# Patient Record
Sex: Female | Born: 2000 | Race: Black or African American | Hispanic: No | Marital: Single | Smoking: Never smoker
Health system: Southern US, Community
[De-identification: ages and names within clinical notes are randomized; demographics above are authoritative.]

## PROBLEM LIST (undated history)

## (undated) HISTORY — PX: NO PAST SURGERIES: SHX2092

---
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
## 2019-01-18 ENCOUNTER — Telehealth: Payer: Self-pay | Admitting: Family Medicine

## 2019-01-18 NOTE — Telephone Encounter (Signed)
Okay to make an appointment.  We can either do it virtually or in the office.  And rather wait and do the blood work afterwards in case I want to check some additional things besides iron.

## 2019-01-18 NOTE — Telephone Encounter (Signed)
Patient states that she has been feeling really cold all the time, really tired not wanting to do anything. States she has been feeling like this for a month now. Wanted to know if she can get her iron checked and come in to see Dr.Metheney for this. Please advise.

## 2019-01-18 NOTE — Telephone Encounter (Signed)
Patient wanted to come in the office. Appointment has been made. No further questions at this time.

## 2019-01-21 ENCOUNTER — Encounter: Payer: Self-pay | Admitting: Family Medicine

## 2019-01-21 ENCOUNTER — Other Ambulatory Visit: Payer: Self-pay

## 2019-01-21 ENCOUNTER — Ambulatory Visit (INDEPENDENT_AMBULATORY_CARE_PROVIDER_SITE_OTHER): Payer: Self-pay | Admitting: Family Medicine

## 2019-01-21 VITALS — BP 103/62 | HR 71 | Temp 98.3°F | Ht 62.0 in | Wt 124.0 lb

## 2019-01-21 DIAGNOSIS — Z113 Encounter for screening for infections with a predominantly sexual mode of transmission: Secondary | ICD-10-CM

## 2019-01-21 DIAGNOSIS — R5383 Other fatigue: Secondary | ICD-10-CM

## 2019-01-21 DIAGNOSIS — R7989 Other specified abnormal findings of blood chemistry: Secondary | ICD-10-CM | POA: Diagnosis not present

## 2019-01-21 DIAGNOSIS — R6889 Other general symptoms and signs: Secondary | ICD-10-CM

## 2019-01-21 DIAGNOSIS — R6883 Chills (without fever): Secondary | ICD-10-CM | POA: Diagnosis not present

## 2019-01-21 LAB — POCT RAPID STREP A (OFFICE): Rapid Strep A Screen: NEGATIVE

## 2019-01-21 NOTE — Progress Notes (Signed)
Acute Office Visit  Subjective:    Patient ID: [employer], female    DOB: 2000/11/11, 18 y.o.   MRN: [id number]  Chief Complaint  Patient presents with  . Chills  . Fatigue    HPI Patient is in today for chills and fatigue.  She I concerned about being iron deficient.  She says it really started as soon as she came off of antibiotics for strep throat back at the end of April.  She had had 2 episodes of strep throat back to back.  She is also just had some cold intolerance since then and just feels extremely tired to the point that she has not been working out and exercising.  She has been purposely try to get a good 8 hours of sleep and says it does not really help she still just wakes up exhausted.  She denies any fevers or sweats.  She denies any urinary symptoms.  No nausea vomiting or diarrhea.  She denies any ice cravings or any unusual food cravings..  She denies any urinary symptoms.  He denies any abnormal lymph nodes or swollen areas on the body.  No sore throat no shortness of breath or cough.  No past medical history on file.  Past Surgical History:  Procedure Laterality Date  . NO PAST SURGERIES    . ROOT CANAL  10/2015    Family History  Problem Relation Age of Onset  . Hypertension Father   . Food Allergy Brother        peanut, shellfish, fish  . Allergic rhinitis Brother   . Cancer Maternal Grandmother        lung  . Hypertension Maternal Grandmother   . Asthma Cousin   . Eczema Cousin   . Angioedema Neg Hx   . Atopy Neg Hx   . Immunodeficiency Neg Hx   . Urticaria Neg Hx     Social History   Socioeconomic History  . Marital status: Single    Spouse name: Not on file  . Number of children: 0  . Years of education: Not on file  . Highest education level: Not on file  Occupational History  . Occupation: Ship broker  Social Needs  . Financial resource strain: Not on file  . Food insecurity    Worry: Not on file    Inability: Not on file  .  Transportation needs    Medical: Not on file    Non-medical: Not on file  Tobacco Use  . Smoking status: Never Smoker  . Smokeless tobacco: Never Used  Substance and Sexual Activity  . Alcohol use: No    Alcohol/week: 0.0 standard drinks  . Drug use: Yes  . Sexual activity: Yes    Birth control/protection: Condom  Lifestyle  . Physical activity    Days per week: Not on file    Minutes per session: Not on file  . Stress: Not on file  Relationships  . Social Herbalist on phone: Not on file    Gets together: Not on file    Attends religious service: Not on file    Active member of club or organization: Not on file    Attends meetings of clubs or organizations: Not on file    Relationship status: Not on file  . Intimate partner violence    Fear of current or ex partner: Not on file    Emotionally abused: Not on file    Physically abused: Not on file  Forced sexual activity: Not on file  Other Topics Concern  . Not on file  Social History Narrative   She currently lives with her mother starlet and her brother Delos Haring. Her parents are separated. Her mother is a Diplomatic Services operational officer and her father is a Insurance claims handler. She currently attends [employer] and is in the 10th grade. She runs track in the fall in the spring and plans on being a fashion designer    Outpatient Medications Prior to Visit  Medication Sig Dispense Refill  . Norgestimate-Ethinyl Estradiol Triphasic 0.18/0.215/0.25 MG-25 MCG tab Take 1 tablet by mouth daily. 3 Package 4   No facility-administered medications prior to visit.     No Known Allergies  ROS     Objective:    Physical Exam  Constitutional: She is oriented to person, place, and time. She appears well-developed and well-nourished.  HENT:  Head: Normocephalic and atraumatic.  Right Ear: External ear normal.  Left Ear: External ear normal.  Nose: Nose normal.  Mouth/Throat: Oropharynx is clear and moist.  TMs and canals are clear.    Eyes: Pupils are equal, round, and reactive to light. Conjunctivae and EOM are normal.  Neck: Neck supple. No thyromegaly present.  Cardiovascular: Normal rate, regular rhythm and normal heart sounds.  Pulmonary/Chest: Effort normal and breath sounds normal. She has no wheezes.  Abdominal: Soft. Bowel sounds are normal. She exhibits no distension and no mass. There is no abdominal tenderness. There is no rebound and no guarding.  Lymphadenopathy:    She has no cervical adenopathy.  Neurological: She is alert and oriented to person, place, and time.  Skin: Skin is warm and dry.  Psychiatric: She has a normal mood and affect. Her behavior is normal.    BP 103/62   Pulse 71   Temp 98.3 F (36.8 C)   Ht _0  (1.575 m)   Wt 124 lb (56.2 kg)   LMP 01/04/2019 (Exact Date)   SpO2 100%   BMI 22.68 kg/m  Wt Readings from Last 3 Encounters:  01/21/19 124 lb (56.2 kg) (44 %, Z= -0.14)*  12/22/18 125 lb (56.7 kg) (47 %, Z= -0.08)*  10/26/18 126 lb (57.2 kg) (49 %, Z= -0.01)*   * Growth percentiles are based on CDC (Girls, 2-20 Years) data.    Health Maintenance Due  Topic Date Due  . HIV Screening  10/31/2014  . CHLAMYDIA SCREENING  06/17/2018    There are no preventive care reminders to display for this patient.   No results found for: TSH No results found for: WBC, HGB, HCT, MCV, PLT No results found for: NA, K, CHLORIDE, CO2, GLUCOSE, BUN, CREATININE, BILITOT, ALKPHOS, AST, ALT, PROT, ALBUMIN, CALCIUM, ANIONGAP, EGFR, GFR No results found for: CHOL No results found for: HDL No results found for: LDLCALC No results found for: TRIG No results found for: CHOLHDL No results found for: HGBA1C     Assessment & Plan:   Problem List Items Addressed This Visit    None    Visit Diagnoses    Fatigue, unspecified type    -  Primary   Relevant Orders   COMPLETE METABOLIC PANEL WITH GFR   CBC   Fe+TIBC+Fer   B12 and Folate Panel   TSH   POCT rapid strep A (Completed)    Chills       Relevant Orders   COMPLETE METABOLIC PANEL WITH GFR   CBC   Fe+TIBC+Fer   B12 and Folate Panel  TSH   Cold intolerance       Screening examination for STD (sexually transmitted disease)       Relevant Orders   C. trachomatis/N. gonorrhoeae RNA     Fatigue and chills-overall doing well with no other specific symptoms but will check for iron deficiency, anemia, thyroid disorder etc.  It is interesting that she started feeling poorly as soon as she finished her antibiotics for strep throat so I do think it is worth may be starting her throat again even though she does not have an active sore throat.  Cold intolerance-check thyroid level as well as check for anemia.  Since she is on birth control recommend yearly STD screening for gonorrhea and chlamydia that was collected today.    No orders of the defined types were placed in this encounter.    Beatrice Lecher, MD

## 2019-01-22 LAB — CBC
HCT: 43.8 % (ref 35.0–45.0)
Hemoglobin: 14.4 g/dL (ref 11.7–15.5)
MCH: 27.3 pg (ref 27.0–33.0)
MCHC: 32.9 g/dL (ref 32.0–36.0)
MCV: 83.1 fL (ref 80.0–100.0)
MPV: 9.7 fL (ref 7.5–12.5)
Platelets: 281 10*3/uL (ref 140–400)
RBC: 5.27 10*6/uL — ABNORMAL HIGH (ref 3.80–5.10)
RDW: 13.2 % (ref 11.0–15.0)
WBC: 4 10*3/uL (ref 3.8–10.8)

## 2019-01-22 LAB — COMPLETE METABOLIC PANEL WITH GFR
AG Ratio: 1.3 (calc) (ref 1.0–2.5)
ALT: 10 U/L (ref 5–32)
AST: 15 U/L (ref 12–32)
Albumin: 4.3 g/dL (ref 3.6–5.1)
Alkaline phosphatase (APISO): 43 U/L (ref 36–128)
BUN: 10 mg/dL (ref 7–20)
CO2: 24 mmol/L (ref 20–32)
Calcium: 9.6 mg/dL (ref 8.9–10.4)
Chloride: 104 mmol/L (ref 98–110)
Creat: 0.79 mg/dL (ref 0.50–1.00)
GFR, Est African American: 126 mL/min/{1.73_m2} (ref >=60)
GFR, Est Non African American: 109 mL/min/{1.73_m2} (ref >=60)
Globulin: 3.4 g/dL (calc) (ref 2.0–3.8)
Glucose, Bld: 81 mg/dL (ref 65–99)
Potassium: 4.4 mmol/L (ref 3.8–5.1)
Sodium: 136 mmol/L (ref 135–146)
Total Bilirubin: 0.4 mg/dL (ref 0.2–1.1)
Total Protein: 7.7 g/dL (ref 6.3–8.2)

## 2019-01-22 LAB — TSH: TSH: 2.39 mIU/L

## 2019-01-22 LAB — C. TRACHOMATIS/N. GONORRHOEAE RNA
C. trachomatis RNA, TMA: NOT DETECTED
N. gonorrhoeae RNA, TMA: NOT DETECTED

## 2019-01-22 LAB — IRON,TIBC AND FERRITIN PANEL
%SAT: 25 % (calc) (ref 15–45)
Ferritin: 7 ng/mL — ABNORMAL LOW (ref 16–154)
Iron: 113 ug/dL (ref 27–164)
TIBC: 453 mcg/dL (calc) — ABNORMAL HIGH (ref 271–448)

## 2019-01-22 LAB — B12 AND FOLATE PANEL
Folate: 17 ng/mL
Vitamin B-12: 644 pg/mL (ref 200–1100)

## 2019-04-20 DIAGNOSIS — L700 Acne vulgaris: Secondary | ICD-10-CM | POA: Diagnosis not present

## 2019-06-21 ENCOUNTER — Ambulatory Visit (INDEPENDENT_AMBULATORY_CARE_PROVIDER_SITE_OTHER): Payer: BC Managed Care – PPO | Admitting: Family Medicine

## 2019-06-21 ENCOUNTER — Other Ambulatory Visit: Payer: Self-pay

## 2019-06-21 ENCOUNTER — Encounter: Payer: Self-pay | Admitting: Family Medicine

## 2019-06-21 VITALS — BP 131/71 | HR 69 | Ht 62.0 in | Wt 126.1 lb

## 2019-06-21 DIAGNOSIS — L309 Dermatitis, unspecified: Secondary | ICD-10-CM | POA: Insufficient documentation

## 2019-06-21 NOTE — Progress Notes (Signed)
Acute Office Visit  Subjective:    Patient ID: Jasmine Wyatt, female    DOB: June 14, 2001, 18 y.o.   MRN: [id number]  Chief Complaint  Patient presents with  . Rash    Started in axillary region and has spread to her lower back area, itching, raised, "looks like goosebumps", denies changes in soap/body wash/detergent/lotions/perfumes/deoderant    HPI Patient is in today for Started in axillary region and has spread to her lower back area, itching, raised, "looks like goosebumps", denies changes in soap/body wash/detergent/lotions/perfumes/deoderant.  She has been mostly using an over-the-counter topical steroid cream and that does seem to help.  She is been putting a moisturizer on the morning but typically takes her shower in the evenings.  She says it is itchy at times but mostly when fabric or things touch her skin.  No fevers chills or sweats.  She does have a cousin and brother with a history of eczema.  History reviewed. No pertinent past medical history.  Past Surgical History:  Procedure Laterality Date  . NO PAST SURGERIES    . ROOT CANAL  10/2015    Family History  Problem Relation Age of Onset  . Hypertension Father   . Food Allergy Brother        peanut, shellfish, fish  . Allergic rhinitis Brother   . Cancer Maternal Grandmother        lung  . Hypertension Maternal Grandmother   . Asthma Cousin   . Eczema Cousin   . Angioedema Neg Hx   . Atopy Neg Hx   . Immunodeficiency Neg Hx   . Urticaria Neg Hx     Social History   Socioeconomic History  . Marital status: Single    Spouse name: Not on file  . Number of children: 0  . Years of education: Not on file  . Highest education level: Not on file  Occupational History  . Occupation: Ship broker  Tobacco Use  . Smoking status: Never Smoker  . Smokeless tobacco: Never Used  Substance and Sexual Activity  . Alcohol use: No    Alcohol/week: 0.0 standard drinks  . Drug use: Yes  . Sexual activity: Yes     Birth control/protection: Condom  Other Topics Concern  . Not on file  Social History Narrative   She currently lives with her mother starlet and her brother Delos Haring. Her parents are separated. Her mother is a Diplomatic Services operational officer and her father is a Insurance claims handler. She currently attends [employer] and is in the 10th grade. She runs track in the fall in the spring and plans on being a fashion designer   Social Determinants of Health   Financial Resource Strain:   . Difficulty of Paying Living Expenses: Not on file  Food Insecurity:   . Worried About Charity fundraiser in the Last Year: Not on file  . Ran Out of Food in the Last Year: Not on file  Transportation Needs:   . Lack of Transportation (Medical): Not on file  . Lack of Transportation (Non-Medical): Not on file  Physical Activity:   . Days of Exercise per Week: Not on file  . Minutes of Exercise per Session: Not on file  Stress:   . Feeling of Stress : Not on file  Social Connections:   . Frequency of Communication with Friends and Family: Not on file  . Frequency of Social Gatherings with Friends and Family: Not on file  . Attends Religious Services: Not  on file  . Active Member of Clubs or Organizations: Not on file  . Attends BankerClub or Organization Meetings: Not on file  . Marital Status: Not on file  Intimate Partner Violence:   . Fear of Current or Ex-Partner: Not on file  . Emotionally Abused: Not on file  . Physically Abused: Not on file  . Sexually Abused: Not on file    Outpatient Medications Prior to Visit  Medication Sig Dispense Refill  . Norgestimate-Ethinyl Estradiol Triphasic 0.18/0.215/0.25 MG-25 MCG tab Take 1 tablet by mouth daily. 3 Package 4   No facility-administered medications prior to visit.    No Known Allergies  Review of Systems     Objective:    Physical Exam Vitals reviewed.  Constitutional:      Appearance: She is well-developed.  HENT:     Head: Normocephalic and atraumatic.   Eyes:     Conjunctiva/sclera: Conjunctivae normal.  Cardiovascular:     Rate and Rhythm: Normal rate.  Pulmonary:     Effort: Pulmonary effort is normal.  Skin:    General: Skin is dry.     Coloration: Skin is not pale.     Comments: Skin looks a little dry on the back of her low back and upper back and under around the axilla.  There is a few papular lesions.  No distinct erythema or thick scale.  Neurological:     Mental Status: She is alert and oriented to person, place, and time.  Psychiatric:        Behavior: Behavior normal.     BP 131/71 (BP Location: Right Arm, Patient Position: Sitting, Cuff Size: Normal)   Pulse 69   Ht 5\' 2"  (1.575 m)   Wt 126 lb 1.3 oz (57.2 kg)   LMP 06/15/2019   SpO2 100%   BMI 23.06 kg/m  Wt Readings from Last 3 Encounters:  06/21/19 126 lb 1.3 oz (57.2 kg) (47 %, Z= -0.08)*  01/21/19 124 lb (56.2 kg) (44 %, Z= -0.14)*  12/22/18 125 lb (56.7 kg) (47 %, Z= -0.08)*   * Growth percentiles are based on CDC (Girls, 2-20 Years) data.    Health Maintenance Due  Topic Date Due  . HIV Screening  10/31/2014    There are no preventive care reminders to display for this patient.   Lab Results  Component Value Date   TSH 2.39 01/21/2019   Lab Results  Component Value Date   WBC 4.0 01/21/2019   HGB 14.4 01/21/2019   HCT 43.8 01/21/2019   MCV 83.1 01/21/2019   PLT 281 01/21/2019   Lab Results  Component Value Date   NA 136 01/21/2019   K 4.4 01/21/2019   CO2 24 01/21/2019   GLUCOSE 81 01/21/2019   BUN 10 01/21/2019   CREATININE 0.79 01/21/2019   BILITOT 0.4 01/21/2019   AST 15 01/21/2019   ALT 10 01/21/2019   PROT 7.7 01/21/2019   CALCIUM 9.6 01/21/2019   No results found for: CHOL No results found for: HDL No results found for: LDLCALC No results found for: TRIG No results found for: CHOLHDL No results found for: [id number]     Assessment & Plan:   Problem List Items Addressed This Visit      Musculoskeletal and  Integument   Eczema - Primary    Based on exam most consistent with eczema.  Seems to be responding well to topical corticosteroid.  Avoid taking hot showers and recommend applying a good moisturizer such as  Cetaphil to the affected areas immediately after showering.  Okay to continue applying the over-the-counter topical steroid.  If you need something stronger than let me know and we will can send over some triamcinolone ointment.  We can also consider checking her thyroid if she is never really had issues before.          No orders of the defined types were placed in this encounter.    Nani Gasser, MD

## 2019-06-21 NOTE — Patient Instructions (Signed)

## 2019-06-21 NOTE — Assessment & Plan Note (Signed)
Based on exam most consistent with eczema.  Seems to be responding well to topical corticosteroid.  Avoid taking hot showers and recommend applying a good moisturizer such as Cetaphil to the affected areas immediately after showering.  Okay to continue applying the over-the-counter topical steroid.  If you need something stronger than let me know and we will can send over some triamcinolone ointment.  We can also consider checking her thyroid if she is never really had issues before.

## 2019-07-29 ENCOUNTER — Telehealth: Payer: BC Managed Care – PPO | Admitting: Family Medicine

## 2019-09-16 ENCOUNTER — Telehealth (INDEPENDENT_AMBULATORY_CARE_PROVIDER_SITE_OTHER): Payer: BC Managed Care – PPO | Admitting: Family Medicine

## 2019-09-16 ENCOUNTER — Encounter: Payer: Self-pay | Admitting: Family Medicine

## 2019-09-16 VITALS — Ht 62.0 in | Wt 115.0 lb

## 2019-09-16 DIAGNOSIS — L709 Acne, unspecified: Secondary | ICD-10-CM | POA: Insufficient documentation

## 2019-09-16 DIAGNOSIS — Z30011 Encounter for initial prescription of contraceptive pills: Secondary | ICD-10-CM | POA: Diagnosis not present

## 2019-09-16 DIAGNOSIS — L708 Other acne: Secondary | ICD-10-CM

## 2019-09-16 NOTE — Assessment & Plan Note (Signed)
Okay to stop her birth control pills.  She is not currently sexually active but did encourage her to call us back if any point she plans on becoming sexually active or if her periods become more heavy or painful with cramps.  We can certainly try a different pill she does not have to continue the one that we started back in the summer.

## 2019-09-16 NOTE — Assessment & Plan Note (Signed)
Currently using urology program.  Not sure if she is actually using a retinoid or adapalene but did suggest maybe a trial of over-the-counter Differin if she is not getting good results with her current program.  Be happy to discuss other prescription options and if in any point she would feel that would be helpful.

## 2019-09-16 NOTE — Progress Notes (Signed)
Virtual Visit via Video Note  I connected with Jasmine Wyatt on 09/16/19 at  2:20 PM EST by a video enabled telemedicine application and verified that I am speaking with the correct person using two identifiers.   I discussed the limitations of evaluation and management by telemedicine and the availability of in person appointments. The patient expressed understanding and agreed to proceed.  Subjective:    CC:   HPI: Pt would like to stop birth control. She stated that she has had a period for 2 weeks. She has been a little stressed and would also like to see if th acne clears up if she no longer is on it.  She says sometimes she feels like the birth control is helping her acne and then other times she feels like it is actually making it worse.  She has been using [facility] program for her acne and just had a chemical peel about 2 weeks ago.  She also asked about a bill that her father received from [facility] that is not being paid thru her insurance. I advised her to call the billing department and ask if this was due to a coding issue and if so to let us know and we will see what can be done on our end to help correct this. She voiced understanding and agreed.   Past medical history, Surgical history, Family history not pertinant except as noted below, Social history, Allergies, and medications have been entered into the medical record, reviewed, and corrections made.   Review of Systems: No fevers, chills, night sweats, weight loss, chest pain, or shortness of breath.   Objective:    General: Speaking clearly in complete sentences without any shortness of breath.  Alert and oriented x3.  Normal judgment. No apparent acute distress.    Impression and Recommendations:    OCP (oral contraceptive pills) initiation Okay to stop her birth control pills.  She is not currently sexually active but did encourage her to call us back if any point she plans on becoming sexually active or if her  periods become more heavy or painful with cramps.  We can certainly try a different pill she does not have to continue the one that we started back in the summer.  Acne Currently using urology program.  Not sure if she is actually using a retinoid or adapalene but did suggest maybe a trial of over-the-counter Differin if she is not getting good results with her current program.  Be happy to discuss other prescription options and if in any point she would feel that would be helpful.   In regards to her lab bill did encourage her to call and find out what was not covered and then let us know we can try to resubmit codes to see if we can get it covered.  I suspect it may be the B12 and possibly the folate that was not covered.   Time spent in encounter 21 minutes  I discussed the assessment and treatment plan with the patient. The patient was provided an opportunity to ask questions and all were answered. The patient agreed with the plan and demonstrated an understanding of the instructions.   The patient was advised to call back or seek an in-person evaluation if the symptoms worsen or if the condition fails to improve as anticipated.   Nani Gasser, MD

## 2019-09-16 NOTE — Progress Notes (Signed)
Pt would like to stop birth control. She stated that she has had a period for 2 weeks. She has been a little stressed and would also like to see if th acne clears up if she no longer is on it.  She also asked about a bill that her father received from [facility] that is not being paid thru her insurance. I advised her to call the billing department and ask if this was due to a coding issue and if so to let us know and we will see what can be done on our end to help correct this. She voiced understanding and agreed.

## 2019-09-17 ENCOUNTER — Encounter: Payer: Self-pay | Admitting: Family Medicine

## 2019-09-24 NOTE — Telephone Encounter (Signed)
Lets try: R68.89Other general symptoms and signs    Please let patient know once we have resubmitted a new code just so that she can be on the look out with her insurance.

## 2019-10-20 ENCOUNTER — Ambulatory Visit: Payer: BC Managed Care – PPO | Admitting: Family Medicine

## 2019-10-21 DIAGNOSIS — F4323 Adjustment disorder with mixed anxiety and depressed mood: Secondary | ICD-10-CM | POA: Diagnosis not present

## 2019-10-28 DIAGNOSIS — F4323 Adjustment disorder with mixed anxiety and depressed mood: Secondary | ICD-10-CM | POA: Diagnosis not present

## 2019-11-04 DIAGNOSIS — F4323 Adjustment disorder with mixed anxiety and depressed mood: Secondary | ICD-10-CM | POA: Diagnosis not present

## 2019-12-29 ENCOUNTER — Telehealth (INDEPENDENT_AMBULATORY_CARE_PROVIDER_SITE_OTHER): Payer: BC Managed Care – PPO | Admitting: Family Medicine

## 2019-12-29 ENCOUNTER — Telehealth: Payer: BC Managed Care – PPO | Admitting: Family Medicine

## 2019-12-29 ENCOUNTER — Encounter: Payer: Self-pay | Admitting: Family Medicine

## 2019-12-29 VITALS — Temp 97.0°F | Ht 62.0 in | Wt 114.0 lb

## 2019-12-29 DIAGNOSIS — F418 Other specified anxiety disorders: Secondary | ICD-10-CM

## 2019-12-29 DIAGNOSIS — Z3009 Encounter for other general counseling and advice on contraception: Secondary | ICD-10-CM | POA: Diagnosis not present

## 2019-12-29 NOTE — Progress Notes (Signed)
Virtual Visit via Video Note  I connected with Jasmine Wyatt on 12/30/19 at 10:10 AM EDT by a video enabled telemedicine application and verified that I am speaking with the correct person using two identifiers.   I discussed the limitations of evaluation and management by telemedicine and the availability of in person appointments. The patient expressed understanding and agreed to proceed.  Patient location: in her car Provider location: in office  Subjective:    CC: stress  HPI: Patient reports that she has been feeling very stressed and down probably since February.  She had a break-up that hit her pretty hard.  She says her mom was really supportive during that time.  But she said she had such severe symptoms that she actually lost 25 pounds and she would just start crying uncontrollably.  She feels like things have gotten better overall since then she is actually even gained a little bit of the weight back but noticed that she still struggling with her mood some.  She said stopping her birth control definitely made the symptoms worse so she would like to get back on birth control  She had stopped her birth control back in mid March and says that she noticed a significant difference in her mood after coming off of birth control so even though she is not currently sexually active she would like to restart it..   Past medical history, Surgical history, Family history not pertinant except as noted below, Social history, Allergies, and medications have been entered into the medical record, reviewed, and corrections made.   Review of Systems: No fevers, chills, night sweats, weight loss, chest pain, or shortness of breath.   Objective:    General: Speaking clearly in complete sentences without any shortness of breath.  Alert and oriented x3.  Normal judgment. No apparent acute distress.    Impression and Recommendations:    Encounter for counseling regarding contraception Discussed  options she would like to restart birth control she definitely noticed an appreciable difference in her mood being off of birth control.  She was happy with the pill that she was on previously so we will just restart that for now went ahead and sent a year supply  Depression with anxiety Gust options.  I do think she would benefit from therapy/counseling.  In addition we also discussed starting medication to help her transition through this time.  I think this could be really helpful for her.  I think it may help take some of that edge off and tearfulness that she has been experiencing.  We will start with Lexapro.  I will see her back in 3 to 4 weeks to make sure that she is doing well discussed potential side effects.  GAD - 7 score of 13 and PHQ- 9 score of 5.      Time spent in encounter 22 minutes  I discussed the assessment and treatment plan with the patient. The patient was provided an opportunity to ask questions and all were answered. The patient agreed with the plan and demonstrated an understanding of the instructions.   The patient was advised to call back or seek an in-person evaluation if the symptoms worsen or if the condition fails to improve as anticipated.   Nani Gasser, MD

## 2019-12-29 NOTE — Progress Notes (Signed)
Pt reports that she has been feeling like this since February but feels that things has since gotten better.   She stated that she has started working again and has the support of her mother and counseling and this has helped her.   She reports that back in mid [place] she stopped her birth control and would like to restart.

## 2019-12-30 ENCOUNTER — Telehealth: Payer: Self-pay | Admitting: Family Medicine

## 2019-12-30 DIAGNOSIS — F418 Other specified anxiety disorders: Secondary | ICD-10-CM | POA: Insufficient documentation

## 2019-12-30 MED ORDER — ESCITALOPRAM OXALATE 10 MG PO TABS: ORAL_TABLET | ORAL | 1 refills | Status: DC

## 2019-12-30 MED ORDER — NORGESTIM-ETH ESTRAD TRIPHASIC 0.18/0.215/0.25 MG-25 MCG PO TABS: 1.0000 | ORAL_TABLET | Freq: Every day | ORAL | 3 refills | Status: DC

## 2019-12-30 NOTE — Addendum Note (Signed)
Addended by: Nani Gasser D on: 12/30/2019 07:56 AM   Modules accepted: Orders

## 2019-12-30 NOTE — Assessment & Plan Note (Signed)
Gust options.  I do think she would benefit from therapy/counseling.  In addition we also discussed starting medication to help her transition through this time.  I think this could be really helpful for her.  I think it may help take some of that edge off and tearfulness that she has been experiencing.  We will start with Lexapro.  I will see her back in 3 to 4 weeks to make sure that she is doing well discussed potential side effects.

## 2019-12-30 NOTE — Assessment & Plan Note (Signed)
Discussed options she would like to restart birth control she definitely noticed an appreciable difference in her mood being off of birth control.  She was happy with the pill that she was on previously so we will just restart that for now went ahead and sent a year supply

## 2019-12-30 NOTE — Telephone Encounter (Signed)
Left message for a return call

## 2019-12-30 NOTE — Telephone Encounter (Signed)
Please call patient and clarify if she wants me to place a referral for a counselor or therapist or if she already has somebody in mind.  I just could not remember what we had decided after our discussion yesterday.  There is a good therapist in [facility] called Santina [facility]rier if that would be convenient for her.

## 2020-01-03 NOTE — Telephone Encounter (Signed)
Called and spoke to patient. She requested that I send this information to her on MyChart and she will respond there.

## 2020-02-14 ENCOUNTER — Encounter: Payer: Self-pay | Admitting: Family Medicine

## 2020-02-14 ENCOUNTER — Telehealth (INDEPENDENT_AMBULATORY_CARE_PROVIDER_SITE_OTHER): Payer: Self-pay | Admitting: Family Medicine

## 2020-02-14 VITALS — Ht 62.0 in

## 2020-02-14 DIAGNOSIS — Z5329 Procedure and treatment not carried out because of patient's decision for other reasons: Secondary | ICD-10-CM

## 2020-02-14 DIAGNOSIS — F418 Other specified anxiety disorders: Secondary | ICD-10-CM

## 2020-02-14 NOTE — Progress Notes (Signed)
Left 2 voice mails for pt. Advised her on the second voice mail to sign in for her visit so Dr. Linford Arnold can see her today.

## 2020-02-14 NOTE — Progress Notes (Signed)
Virtual Visit via Video Note   NO SHOW.  She was called twice and left messages.  Also sent a text to connect to my chart and patient never connected.

## 2020-03-06 ENCOUNTER — Encounter: Payer: Self-pay | Admitting: Family Medicine

## 2020-03-06 ENCOUNTER — Ambulatory Visit (INDEPENDENT_AMBULATORY_CARE_PROVIDER_SITE_OTHER): Payer: Managed Care, Other (non HMO) | Admitting: Family Medicine

## 2020-03-06 VITALS — BP 127/79 | HR 101 | Ht 62.0 in | Wt 118.0 lb

## 2020-03-06 DIAGNOSIS — N898 Other specified noninflammatory disorders of vagina: Secondary | ICD-10-CM

## 2020-03-06 DIAGNOSIS — J029 Acute pharyngitis, unspecified: Secondary | ICD-10-CM

## 2020-03-06 LAB — WET PREP FOR TRICH, YEAST, CLUE
MICRO NUMBER:: 10888090
Specimen Quality: ADEQUATE

## 2020-03-06 NOTE — Progress Notes (Signed)
Acute Office Visit  Subjective:    Patient ID: Jasmine Wyatt, female    DOB: 2000-11-06, 19 y.o.   MRN: [id number]  Chief Complaint  Patient presents with  . std testing  . Vaginitis    HPI Patient is in today for change in vaginal discharge with some itching mostly in the mornings.  No external lesions.  She did have unprotected sex recently.  She has not tried any over-the-counter medications for relief.  ST for a few days as well.  She says it is mild she has not had any cough or congestion etc. been about the same time that she is had some vaginal discomfort.  She wonders if it could be gonorrhea and is concerned about that.  No past medical history on file.  Past Surgical History:  Procedure Laterality Date  . NO PAST SURGERIES    . ROOT CANAL  10/2015    Family History  Problem Relation Age of Onset  . Hypertension Father   . Food Allergy Brother        peanut, shellfish, fish  . Allergic rhinitis Brother   . Cancer Maternal Grandmother        lung  . Hypertension Maternal Grandmother   . Asthma Cousin   . Eczema Cousin   . Angioedema Neg Hx   . Atopy Neg Hx   . Immunodeficiency Neg Hx   . Urticaria Neg Hx     Social History   Socioeconomic History  . Marital status: Single    Spouse name: Not on file  . Number of children: 0  . Years of education: Not on file  . Highest education level: Not on file  Occupational History  . Occupation: Consulting civil engineer  Tobacco Use  . Smoking status: Never Smoker  . Smokeless tobacco: Never Used  Vaping Use  . Vaping Use: Never used  Substance and Sexual Activity  . Alcohol use: No    Alcohol/week: 0.0 standard drinks  . Drug use: Yes  . Sexual activity: Yes    Birth control/protection: Condom  Other Topics Concern  . Not on file  Social History Narrative   She currently lives with her mother starlet and her brother Forde Radon. Her parents are separated. Her mother is a Social research officer, government and her father is a Actuary. She  currently attends [employer] and is in the 10th grade. She runs track in the fall in the spring and plans on being a fashion designer   Social Determinants of Health   Financial Resource Strain:   . Difficulty of Paying Living Expenses: Not on file  Food Insecurity:   . Worried About Programme researcher, broadcasting/film/video in the Last Year: Not on file  . Ran Out of Food in the Last Year: Not on file  Transportation Needs:   . Lack of Transportation (Medical): Not on file  . Lack of Transportation (Non-Medical): Not on file  Physical Activity:   . Days of Exercise per Week: Not on file  . Minutes of Exercise per Session: Not on file  Stress:   . Feeling of Stress : Not on file  Social Connections:   . Frequency of Communication with Friends and Family: Not on file  . Frequency of Social Gatherings with Friends and Family: Not on file  . Attends Religious Services: Not on file  . Active Member of Clubs or Organizations: Not on file  . Attends Banker Meetings: Not on file  .  Marital Status: Not on file  Intimate Partner Violence:   . Fear of Current or Ex-Partner: Not on file  . Emotionally Abused: Not on file  . Physically Abused: Not on file  . Sexually Abused: Not on file    Outpatient Medications Prior to Visit  Medication Sig Dispense Refill  . escitalopram (LEXAPRO) 10 MG tablet 1/2 tab po QD x 8 days and then increase to 1 tab daily 30 tablet 1  . Norgestimate-Ethinyl Estradiol Triphasic 0.18/0.215/0.25 MG-25 MCG tab Take 1 tablet by mouth daily. 84 tablet 3   No facility-administered medications prior to visit.    No Known Allergies  Review of Systems     Objective:    Physical Exam Constitutional:      Appearance: Normal appearance.  HENT:     Head: Normocephalic.     Nose: Nose normal.     Mouth/Throat:     Mouth: Mucous membranes are moist.     Comments: Pharynx is mildly erythematous.  No discrete lesions or papules or vesicles. Neck:      Comments: Bilateral anterior cervical lymphadenopathy. Pulmonary:     Effort: Pulmonary effort is normal.  Psychiatric:        Mood and Affect: Mood normal.        Behavior: Behavior normal.     BP 127/79   Pulse (!) 101   Ht 5\' 2"  (1.575 m)   Wt 118 lb (53.5 kg)   LMP 02/07/2020 (Exact Date)   SpO2 97%   BMI 21.58 kg/m  Wt Readings from Last 3 Encounters:  03/06/20 118 lb (53.5 kg)  12/29/19 114 lb (51.7 kg)  09/16/19 115 lb (52.2 kg) (24 %, Z= -0.71)*   * Growth percentiles are based on CDC (Girls, 2-20 Years) data.    Health Maintenance Due  Topic Date Due  . Hepatitis C Screening  Never done  . HIV Screening  Never done  . CHLAMYDIA SCREENING  01/21/2020    There are no preventive care reminders to display for this patient.   Lab Results  Component Value Date   TSH 2.39 01/21/2019   Lab Results  Component Value Date   WBC 4.0 01/21/2019   HGB 14.4 01/21/2019   HCT 43.8 01/21/2019   MCV 83.1 01/21/2019   PLT 281 01/21/2019   Lab Results  Component Value Date   NA 136 01/21/2019   K 4.4 01/21/2019   CO2 24 01/21/2019   GLUCOSE 81 01/21/2019   BUN 10 01/21/2019   CREATININE 0.79 01/21/2019   BILITOT 0.4 01/21/2019   AST 15 01/21/2019   ALT 10 01/21/2019   PROT 7.7 01/21/2019   CALCIUM 9.6 01/21/2019   No results found for: CHOL No results found for: HDL No results found for: LDLCALC No results found for: TRIG No results found for: CHOLHDL No results found for: 01/23/2019     Assessment & Plan:   Problem List Items Addressed This Visit    None    Visit Diagnoses    Vaginal discharge    -  Primary   Relevant Orders   C. trachomatis/N. gonorrhoeae RNA   WET PREP FOR TRICH, YEAST, CLUE   Vaginal itching       Relevant Orders   C. trachomatis/N. gonorrhoeae RNA   WET PREP FOR TRICH, YEAST, CLUE   Pharyngitis, unspecified etiology       Relevant Orders   C. trachomatis/N. gonorrhoeae RNA      Vaginitis-wet prep performed will call  with  results.  Urine GC and chlamydia sent as well.  Consider testing for HIV and RPR as well.  Will call with results once available.  Pharyngitis-swab for gonorrhea.  Recommend symptomatic care if not improving by the end of the week then please let us know.  No orders of the defined types were placed in this encounter.    Nani Gasser, MD

## 2020-03-06 NOTE — Progress Notes (Signed)
She reports that she has had some d/c no odor. She has had some itching in the mornings. She hasn't tried any OTC medications

## 2020-03-08 ENCOUNTER — Encounter: Payer: Self-pay | Admitting: Family Medicine

## 2020-03-08 LAB — C. TRACHOMATIS/N. GONORRHOEAE RNA
C. trachomatis RNA, TMA: NOT DETECTED
C. trachomatis RNA, TMA: NOT DETECTED
N. gonorrhoeae RNA, TMA: NOT DETECTED
N. gonorrhoeae RNA, TMA: NOT DETECTED

## 2020-03-10 ENCOUNTER — Other Ambulatory Visit: Payer: Self-pay | Admitting: Family Medicine

## 2020-03-10 DIAGNOSIS — F418 Other specified anxiety disorders: Secondary | ICD-10-CM

## 2020-04-20 ENCOUNTER — Ambulatory Visit: Payer: Managed Care, Other (non HMO) | Admitting: Medical-Surgical

## 2020-04-21 ENCOUNTER — Ambulatory Visit (INDEPENDENT_AMBULATORY_CARE_PROVIDER_SITE_OTHER): Payer: Managed Care, Other (non HMO) | Admitting: Medical-Surgical

## 2020-04-21 DIAGNOSIS — Z5329 Procedure and treatment not carried out because of patient's decision for other reasons: Secondary | ICD-10-CM

## 2020-04-21 NOTE — Progress Notes (Signed)
   Complete physical exam  Patient: Jasmine Wyatt   DOB: 04/27/2000   19 y.o. Female  MRN: [id number]  Subjective:    No chief complaint on file.   Jasmine Wyatt is a 19 y.o. female who presents today for a complete physical exam. She reports consuming a {diet types:17450} diet. {types:19826} She generally feels {DESC; WELL/FAIRLY WELL/POORLY:18703}. She reports sleeping {DESC; WELL/FAIRLY WELL/POORLY:18703}. She {does/does not:200015} have additional problems to discuss today.    Most recent fall risk assessment:    01/02/2022   10:42 AM  Fall Risk   Falls in the past year? 0  Number falls in past yr: 0  Injury with Fall? 0  Risk for fall due to : No Fall Risks  Follow up Falls evaluation completed     Most recent depression screenings:    01/02/2022   10:42 AM 11/23/2020   10:46 AM  PHQ 2/9 Scores  PHQ - 2 Score 0 0  PHQ- 9 Score 5     {VISON DENTAL STD PSA (Optional):27386}  {History (Optional):23778}  Patient Care Team: Amarri Michaelson, NP as PCP - General (Nurse Practitioner)   Outpatient Medications Prior to Visit  Medication Sig   fluticasone (FLONASE) 50 MCG/ACT nasal spray Place 2 sprays into both nostrils in the morning and at bedtime. After 7 days, reduce to once daily.   norgestimate-ethinyl estradiol (SPRINTEC 28) 0.25-35 MG-MCG tablet Take 1 tablet by mouth daily.   Nystatin POWD Apply liberally to affected area 2 times per day   spironolactone (ALDACTONE) 100 MG tablet Take 1 tablet (100 mg total) by mouth daily.   No facility-administered medications prior to visit.    ROS        Objective:     There were no vitals taken for this visit. {Vitals History (Optional):23777}  Physical Exam   No results found for any visits on 02/07/22. {Show previous labs (optional):23779}    Assessment & Plan:    Routine Health Maintenance and Physical Exam  Immunization History  Administered Date(s) Administered   DTaP 07/11/1999, 09/06/1999,  11/15/1999, 07/31/2000, 02/14/2004   Hepatitis A 12/11/2007, 12/16/2008   Hepatitis B 04/28/1999, 06/05/1999, 11/15/1999   HiB (PRP-OMP) 07/11/1999, 09/06/1999, 11/15/1999, 07/31/2000   IPV 07/11/1999, 09/06/1999, 05/05/2000, 02/14/2004   Influenza,inj,Quad PF,6+ Mos 03/18/2014   Influenza-Unspecified 06/17/2012   MMR 05/05/2001, 02/14/2004   Meningococcal Polysaccharide 12/16/2011   Pneumococcal Conjugate-13 07/31/2000   Pneumococcal-Unspecified 11/15/1999, 01/29/2000   Tdap 12/16/2011   Varicella 05/05/2000, 12/11/2007    Health Maintenance  Topic Date Due   HIV Screening  Never done   Hepatitis C Screening  Never done   INFLUENZA VACCINE  02/05/2022   PAP-Cervical Cytology Screening  02/07/2022 (Originally 04/26/2020)   PAP SMEAR-Modifier  02/07/2022 (Originally 04/26/2020)   TETANUS/TDAP  02/07/2022 (Originally 12/15/2021)   HPV VACCINES  Discontinued   COVID-19 Vaccine  Discontinued    Discussed health benefits of physical activity, and encouraged her to engage in regular exercise appropriate for her age and condition.  Problem List Items Addressed This Visit   None Visit Diagnoses     Annual physical exam    -  Primary   Cervical cancer screening       Need for Tdap vaccination          No follow-ups on file.     Vela Render, NP   

## 2020-05-01 ENCOUNTER — Ambulatory Visit: Payer: Managed Care, Other (non HMO) | Admitting: Medical-Surgical

## 2020-05-02 ENCOUNTER — Telehealth (INDEPENDENT_AMBULATORY_CARE_PROVIDER_SITE_OTHER): Payer: Managed Care, Other (non HMO) | Admitting: Family Medicine

## 2020-05-02 ENCOUNTER — Encounter: Payer: Self-pay | Admitting: Family Medicine

## 2020-05-02 VITALS — Wt 118.0 lb

## 2020-05-02 DIAGNOSIS — F418 Other specified anxiety disorders: Secondary | ICD-10-CM | POA: Diagnosis not present

## 2020-05-02 DIAGNOSIS — N760 Acute vaginitis: Secondary | ICD-10-CM | POA: Diagnosis not present

## 2020-05-02 MED ORDER — FLUCONAZOLE 150 MG PO TABS: 150.0000 mg | ORAL_TABLET | Freq: Once | ORAL | 0 refills | Status: AC

## 2020-05-02 MED ORDER — ESCITALOPRAM OXALATE 10 MG PO TABS: 10.0000 mg | ORAL_TABLET | Freq: Every day | ORAL | 1 refills | Status: DC

## 2020-05-02 NOTE — Assessment & Plan Note (Signed)
Feels like she is doing really well on current regimen.  We discussed goals of care and discuss staying on it for at least 6 to 12 months before tapering off.  She says she like to get through the holidays and maybe address that again at that point if she is doing really well then will consider tapering off the medication she is also planning on getting back into counseling a little bit more consistently she did feel like it is been helpful.

## 2020-05-02 NOTE — Progress Notes (Signed)
Pt reports that her sxs started 2 days ago. She has used Vagisil for the irritation but this didn't help.

## 2020-05-02 NOTE — Progress Notes (Signed)
Virtual Visit via Video Note  I connected with Jasmine Wyatt on 05/02/20 at 11:10 AM EDT by a video enabled telemedicine application and verified that I am speaking with the correct person using two identifiers.   I discussed the limitations of evaluation and management by telemedicine and the availability of in person appointments. The patient expressed understanding and agreed to proceed.  Patient location: at home Provider location: in office  Subjective:    CC: vaginal irritation  HPI: 2 days of vaginal irritation she says it is very intense itching she has not noticed any vaginal odor she still has a clear discharge but says it looks a little bit more cottage cheesy.  She says the itching has been so intense is actually been waking her up at night.  She actually had similar symptoms about a month ago for about 2 days and then she started her period and then it actually went away.  She also wonders if it could be related to the Lexapro  Follow up for depression/anxiety-she says that the Lexapro has actually been working really well.  She has been doing some therapy but not consistently but says she plans on getting back on track with that.  I started her on Lexapro at the end of June so she has been on it for about 4 months now.  Past medical history, Surgical history, Family history not pertinant except as noted below, Social history, Allergies, and medications have been entered into the medical record, reviewed, and corrections made.   Review of Systems: No fevers, chills, night sweats, weight loss, chest pain, or shortness of breath.   Objective:    General: Speaking clearly in complete sentences without any shortness of breath.  Alert and oriented x3.  Normal judgment. No apparent acute distress.    Impression and Recommendations:    Depression with anxiety Feels like she is doing really well on current regimen.  We discussed goals of care and discuss staying on it for at  least 6 to 12 months before tapering off.  She says she like to get through the holidays and maybe address that again at that point if she is doing really well then will consider tapering off the medication she is also planning on getting back into counseling a little bit more consistently she did feel like it is been helpful.  Acute vaginitis-we will treat with Diflucan if not improving then recommend wet prep for further work-up.    Time spent in encounter 20 minutes  I discussed the assessment and treatment plan with the patient. The patient was provided an opportunity to ask questions and all were answered. The patient agreed with the plan and demonstrated an understanding of the instructions.   The patient was advised to call back or seek an in-person evaluation if the symptoms worsen or if the condition fails to improve as anticipated.   Nani Gasser, MD

## 2020-05-17 ENCOUNTER — Other Ambulatory Visit: Payer: Self-pay | Admitting: Family Medicine

## 2020-05-17 DIAGNOSIS — F418 Other specified anxiety disorders: Secondary | ICD-10-CM

## 2020-06-05 ENCOUNTER — Encounter: Payer: Self-pay | Admitting: Family Medicine

## 2020-06-05 ENCOUNTER — Telehealth (INDEPENDENT_AMBULATORY_CARE_PROVIDER_SITE_OTHER): Payer: Managed Care, Other (non HMO) | Admitting: Family Medicine

## 2020-06-05 VITALS — Ht 62.0 in | Wt 119.0 lb

## 2020-06-05 DIAGNOSIS — N760 Acute vaginitis: Secondary | ICD-10-CM | POA: Diagnosis not present

## 2020-06-05 MED ORDER — FLUCONAZOLE 150 MG PO TABS: 150.0000 mg | ORAL_TABLET | Freq: Every day | ORAL | 1 refills | Status: DC

## 2020-06-05 NOTE — Progress Notes (Signed)
Virtual Visit via Telephone Note  I connected with Jasmine Wyatt on 06/05/20 at  2:40 PM EST by telephone and verified that I am speaking with the correct person using two identifiers.   I discussed the limitations, risks, security and privacy concerns of performing an evaluation and management service by telephone and the availability of in person appointments. I also discussed with the patient that there may be a patient responsible charge related to this service. The patient expressed understanding and agreed to proceed.  Patient location: at home  Provider loccation: In office   Subjective:    CC: vaginal itching   HPI: Vaginal itching x 2 days.  Had similar sx about 3 month ago.  Wet prep was negative at that time as well as GC and chlamydia testing.  She says she has noticed that it seems to be occurring right around and right after her menstrual..  She has started wearing an adult diaper around the time of.  Just at night to catch any excess bleeding and really feels like that is likely triggering some of her symptoms.  Past medical history, Surgical history, Family history not pertinant except as noted below, Social history, Allergies, and medications have been entered into the medical record, reviewed, and corrections made.   Review of Systems: No fevers, chills, night sweats, weight loss, chest pain, or shortness of breath.   Objective:    General: Speaking clearly in complete sentences without any shortness of breath.  Alert and oriented x3.  Normal judgment. No apparent acute distress.    Impression and Recommendations:    Acute vaginitis-likely yeast/candidal infection.  Will treat with oral Diflucan prescription sent to pharmacy.  Recent GC chlamydia testing negative.  If symptoms do not improve then recommend further work-up with wet prep evaluation as well as testing for herpes simplex as this can sometimes cause vaginal irritation and itching as well.    I discussed  the assessment and treatment plan with the patient. The patient was provided an opportunity to ask questions and all were answered. The patient agreed with the plan and demonstrated an understanding of the instructions.   The patient was advised to call back or seek an in-person evaluation if the symptoms worsen or if the condition fails to improve as anticipated.  I provided 10 minutes of non-face-to-face time during this encounter.   Nani Gasser, MD

## 2020-07-04 ENCOUNTER — Encounter: Payer: Self-pay | Admitting: Family Medicine

## 2020-07-04 ENCOUNTER — Ambulatory Visit (INDEPENDENT_AMBULATORY_CARE_PROVIDER_SITE_OTHER): Payer: Managed Care, Other (non HMO) | Admitting: Family Medicine

## 2020-07-04 ENCOUNTER — Other Ambulatory Visit: Payer: Self-pay

## 2020-07-04 VITALS — BP 112/76 | HR 76 | Ht 62.0 in | Wt 120.0 lb

## 2020-07-04 DIAGNOSIS — H9202 Otalgia, left ear: Secondary | ICD-10-CM | POA: Diagnosis not present

## 2020-07-04 DIAGNOSIS — R5383 Other fatigue: Secondary | ICD-10-CM

## 2020-07-04 DIAGNOSIS — N898 Other specified noninflammatory disorders of vagina: Secondary | ICD-10-CM

## 2020-07-04 DIAGNOSIS — R0982 Postnasal drip: Secondary | ICD-10-CM | POA: Diagnosis not present

## 2020-07-04 DIAGNOSIS — H65193 Other acute nonsuppurative otitis media, bilateral: Secondary | ICD-10-CM

## 2020-07-04 LAB — WET PREP FOR TRICH, YEAST, CLUE
MICRO NUMBER:: 11361814
Specimen Quality: ADEQUATE

## 2020-07-04 MED ORDER — FLUTICASONE PROPIONATE 50 MCG/ACT NA SUSP: 1.0000 | Freq: Every day | NASAL | 6 refills | Status: DC

## 2020-07-04 NOTE — Progress Notes (Signed)
Established Patient Office Visit  Subjective:  Patient ID: Jasmine Wyatt, female    DOB: Feb 27, 2001  Age: 19 y.o. MRN: [id number]  CC:  Chief Complaint  Patient presents with  . Ear Pain  . Vaginal Discharge    HPI BONNEE ZERTUCHE presents for left ear pain that is been going on for between 1 to 2 weeks.  She says it really just feels like it is getting full with some pressure.  No drainage.  No fevers chills or sweats.  No upper respiratory symptoms.  She says it happens intermittently.  She has had some persistent postnasal drip for the last month or more.  She denies any fevers, chills, headaches or body aches.  He also complains of vaginal discharge with odor.  She said she actually ended up using some of her mom's boric acid suppositories and that actually seemed to help in fact her symptoms are better but she did want to mention it today.  Also reports that she still feels extremely tired this has been going on for months.  She does have fairly heavy periods.  She feels like if she gets 8 hours of rest she does not even feel well rested she says she can really sleep over 12 hours at a time and not even wake up.  No past medical history on file.  Past Surgical History:  Procedure Laterality Date  . NO PAST SURGERIES    . ROOT CANAL  10/2015    Family History  Problem Relation Age of Onset  . Hypertension Father   . Food Allergy Brother        peanut, shellfish, fish  . Allergic rhinitis Brother   . Cancer Maternal Grandmother        lung  . Hypertension Maternal Grandmother   . Asthma Cousin   . Eczema Cousin   . Angioedema Neg Hx   . Atopy Neg Hx   . Immunodeficiency Neg Hx   . Urticaria Neg Hx     Social History   Socioeconomic History  . Marital status: Single    Spouse name: Not on file  . Number of children: 0  . Years of education: Not on file  . Highest education level: Not on file  Occupational History  . Occupation: Consulting civil engineer  Tobacco Use  .  Smoking status: Never Smoker  . Smokeless tobacco: Never Used  Vaping Use  . Vaping Use: Never used  Substance and Sexual Activity  . Alcohol use: No    Alcohol/week: 0.0 standard drinks  . Drug use: Yes  . Sexual activity: Yes    Birth control/protection: Condom  Other Topics Concern  . Not on file  Social History Narrative   She currently lives with her mother starlet and her brother Forde Radon. Her parents are separated. Her mother is a Social research officer, government and her father is a Actuary. She currently attends [employer] and is in the 10th grade. She runs track in the fall in the spring and plans on being a fashion designer   Social Determinants of Health   Financial Resource Strain: Not on file  Food Insecurity: Not on file  Transportation Needs: Not on file  Physical Activity: Not on file  Stress: Not on file  Social Connections: Not on file  Intimate Partner Violence: Not on file    Outpatient Medications Prior to Visit  Medication Sig Dispense Refill  . escitalopram (LEXAPRO) 10 MG tablet Take 1 tablet (10 mg total)  by mouth daily. 90 tablet 1  . fluconazole (DIFLUCAN) 150 MG tablet Take 1 tablet (150 mg total) by mouth daily. 1 tablet 1  . Norgestimate-Ethinyl Estradiol Triphasic 0.18/0.215/0.25 MG-25 MCG tab Take 1 tablet by mouth daily. (Patient not taking: Reported on 06/05/2020) 84 tablet 3   No facility-administered medications prior to visit.    No Known Allergies  ROS Review of Systems    Objective:    Physical Exam Constitutional:      Appearance: She is well-developed.  HENT:     Head: Normocephalic and atraumatic.     Right Ear: Ear canal and external ear normal.     Left Ear: Ear canal and external ear normal.     Ears:     Comments: Actually has some fluid layering in the bottom of both ears.  No significant erythema canals are nice and open no swelling.  And no debris.    Nose: Nose normal.     Mouth/Throat:     Pharynx: No oropharyngeal exudate  or posterior oropharyngeal erythema.  Eyes:     Conjunctiva/sclera: Conjunctivae normal.     Pupils: Pupils are equal, round, and reactive to light.  Neck:     Thyroid: No thyromegaly.  Cardiovascular:     Rate and Rhythm: Normal rate and regular rhythm.     Heart sounds: Normal heart sounds.  Pulmonary:     Effort: Pulmonary effort is normal.     Breath sounds: Normal breath sounds. No wheezing.  Musculoskeletal:     Cervical back: Neck supple.  Lymphadenopathy:     Cervical: No cervical adenopathy.  Skin:    General: Skin is warm and dry.  Neurological:     Mental Status: She is alert and oriented to person, place, and time.     BP 112/76   Pulse 76   Ht 5\' 2"  (1.575 m)   Wt 120 lb (54.4 kg)   LMP 06/21/2020 (Exact Date)   SpO2 100%   BMI 21.95 kg/m  Wt Readings from Last 3 Encounters:  07/04/20 120 lb (54.4 kg)  06/05/20 119 lb (54 kg)  05/02/20 118 lb (53.5 kg)     Health Maintenance Due  Topic Date Due  . Hepatitis C Screening  Never done  . HIV Screening  Never done    There are no preventive care reminders to display for this patient.  Lab Results  Component Value Date   TSH 2.39 01/21/2019   Lab Results  Component Value Date   WBC 4.0 01/21/2019   HGB 14.4 01/21/2019   HCT 43.8 01/21/2019   MCV 83.1 01/21/2019   PLT 281 01/21/2019   Lab Results  Component Value Date   NA 136 01/21/2019   K 4.4 01/21/2019   CO2 24 01/21/2019   GLUCOSE 81 01/21/2019   BUN 10 01/21/2019   CREATININE 0.79 01/21/2019   BILITOT 0.4 01/21/2019   AST 15 01/21/2019   ALT 10 01/21/2019   PROT 7.7 01/21/2019   CALCIUM 9.6 01/21/2019   No results found for: CHOL No results found for: HDL No results found for: LDLCALC No results found for: TRIG No results found for: CHOLHDL No results found for: 01/23/2019    Assessment & Plan:   Problem List Items Addressed This Visit   None   Visit Diagnoses    Vaginal discharge    -  Primary   Relevant Orders   WET  PREP FOR TRICH, YEAST, CLUE   Left ear pain  Post-nasal drip       Fatigue, unspecified type       Relevant Orders   CBC with Differential/Platelet   Ferritin   Epstein-Barr Virus VCA Antibody Panel   CMV abs, IgG+IgM (cytomegalovirus)   TSH   Acute effusion of both middle ears         Vaginitis-we will do wet prep for further work-up it sounds like the boric acid suppository seems to have helped.  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  Left ear pain-suspect eustachian tube dysfunction with a little bit of an effusion.  On exam she actually has bilateral effusion.  We will treat with nasal steroid spray and Sudafed.  If not proving over the next 2 weeks and consider trial of prednisone and if that is not effective then plan to refer to ENT for further work-up.  Fatigue-we did labs about a year and a half ago that were essentially normal but she is complaining mostly of excessive sleeping.  No abnormal night sweats, fevers or abnormal weight loss etc.  We will check for anemia, low iron, also check for CMV and EBV.  We will check for thyroid disorder  Meds ordered this encounter  Medications  . fluticasone (FLONASE) 50 MCG/ACT nasal spray    Sig: Place 1-2 sprays into both nostrils daily.    Dispense:  16 g    Refill:  6    Follow-up: No follow-ups on file.    Nani Gasser, MD

## 2020-07-04 NOTE — Patient Instructions (Signed)
Start nasal steroid spray such as Flonase or Nasonex.  I did send over a prescription version to the pharmacy with.  You can see if it is cheaper than buying the over-the-counter version.  Also recommend a trial of Sudafed daily for the next 5 to 7 days only in the morning.  If you are not getting better over the next 2 weeks then please let me know.

## 2020-07-04 NOTE — Progress Notes (Signed)
All labs are normal. 

## 2020-07-04 NOTE — Progress Notes (Signed)
  L ear pain x 2 weeks. Denies f/s/c/n/v/d/sore throat/body aches,headache.  She had some sinus drainage. Did not take anything for this.   She also c/o vaginal discharge that did have an odor but has since gone away. She used boric acid for this.

## 2020-07-17 ENCOUNTER — Encounter: Payer: Self-pay | Admitting: Nurse Practitioner

## 2020-07-17 ENCOUNTER — Other Ambulatory Visit: Payer: Self-pay

## 2020-07-17 ENCOUNTER — Ambulatory Visit (INDEPENDENT_AMBULATORY_CARE_PROVIDER_SITE_OTHER): Payer: Managed Care, Other (non HMO) | Admitting: Nurse Practitioner

## 2020-07-17 VITALS — BP 112/70 | HR 75 | Temp 98.3°F | Ht 62.0 in | Wt 124.8 lb

## 2020-07-17 DIAGNOSIS — H65493 Other chronic nonsuppurative otitis media, bilateral: Secondary | ICD-10-CM | POA: Diagnosis not present

## 2020-07-17 DIAGNOSIS — H938X3 Other specified disorders of ear, bilateral: Secondary | ICD-10-CM

## 2020-07-17 NOTE — Patient Instructions (Addendum)
You can try to take Allegra, Claritin, or Zyrtec to see if this helps with allergy-like symptoms including post nasal drip and ear pressure/fullness. If this seems to be working, you can cancel the ENT appointment, but at least we will have something set up in the event this does not work for you.   Eustachian Tube Dysfunction  Eustachian tube dysfunction refers to a condition in which a blockage develops in the narrow passage that connects the middle ear to the back of the nose (eustachian tube). The eustachian tube regulates air pressure in the middle ear by letting air move between the ear and nose. It also helps to drain fluid from the middle ear space. Eustachian tube dysfunction can affect one or both ears. When the eustachian tube does not function properly, air pressure, fluid, or both can build up in the middle ear. What are the causes? This condition occurs when the eustachian tube becomes blocked or cannot open normally. Common causes of this condition include:  Ear infections.  Colds and other infections that affect the nose, mouth, and throat (upper respiratory tract).  Allergies.  Irritation from cigarette smoke.  Irritation from stomach acid coming up into the esophagus (gastroesophageal reflux). The esophagus is the tube that carries food from the mouth to the stomach.  Sudden changes in air pressure, such as from descending in an airplane or scuba diving.  Abnormal growths in the nose or throat, such as: ? Growths that line the nose (nasal polyps). ? Abnormal growth of cells (tumors). ? Enlarged tissue at the back of the throat (adenoids). What increases the risk? You are more likely to develop this condition if:  You smoke.  You are overweight.  You are a child who has: ? Certain birth defects of the mouth, such as cleft palate. ? Large tonsils or adenoids. What are the signs or symptoms? Common symptoms of this condition include:  A feeling of fullness in the  ear.  Ear pain.  Clicking or popping noises in the ear.  Ringing in the ear.  Hearing loss.  Loss of balance.  Dizziness. Symptoms may get worse when the air pressure around you changes, such as when you travel to an area of high elevation, fly on an airplane, or go scuba diving. How is this diagnosed? This condition may be diagnosed based on:  Your symptoms.  A physical exam of your ears, nose, and throat.  Tests, such as those that measure: ? The movement of your eardrum (tympanogram). ? Your hearing (audiometry). How is this treated? Treatment depends on the cause and severity of your condition.  In mild cases, you may relieve your symptoms by moving air into your ears. This is called "popping the ears."  In more severe cases, or if you have symptoms of fluid in your ears, treatment may include: ? Medicines to relieve congestion (decongestants). ? Medicines that treat allergies (antihistamines). ? Nasal sprays or ear drops that contain medicines that reduce swelling (steroids). ? A procedure to drain the fluid in your eardrum (myringotomy). In this procedure, a small tube is placed in the eardrum to:  Drain the fluid.  Restore the air in the middle ear space. ? A procedure to insert a balloon device through the nose to inflate the opening of the eustachian tube (balloon dilation). Follow these instructions at home: Lifestyle  Do not do any of the following until your health care provider approves: ? Travel to high altitudes. ? Fly in airplanes. ? Work in  a pressurized cabin or room. ? Scuba dive.  Do not use any products that contain nicotine or tobacco, such as cigarettes and e-cigarettes. If you need help quitting, ask your health care provider.  Keep your ears dry. Wear fitted earplugs during showering and bathing. Dry your ears completely after. General instructions  Take over-the-counter and prescription medicines only as told by your health care  provider.  Use techniques to help pop your ears as recommended by your health care provider. These may include: ? Chewing gum. ? Yawning. ? Frequent, forceful swallowing. ? Closing your mouth, holding your nose closed, and gently blowing as if you are trying to blow air out of your nose.  Keep all follow-up visits as told by your health care provider. This is important. Contact a health care provider if:  Your symptoms do not go away after treatment.  Your symptoms come back after treatment.  You are unable to pop your ears.  You have: ? A fever. ? Pain in your ear. ? Pain in your head or neck. ? Fluid draining from your ear.  Your hearing suddenly changes.  You become very dizzy.  You lose your balance. Summary  Eustachian tube dysfunction refers to a condition in which a blockage develops in the eustachian tube.  It can be caused by ear infections, allergies, inhaled irritants, or abnormal growths in the nose or throat.  Symptoms include ear pain, hearing loss, or ringing in the ears.  Mild cases are treated with maneuvers to unblock the ears, such as yawning or ear popping.  Severe cases are treated with medicines. Surgery may also be done (rare). This information is not intended to replace advice given to you by your health care provider. Make sure you discuss any questions you have with your health care provider. Document Revised: 10/14/2017 Document Reviewed: 10/14/2017 Elsevier Patient Education  2021 [employer].

## 2020-07-17 NOTE — Progress Notes (Signed)
Acute Office Visit  Subjective:    Patient ID: Jasmine Wyatt, female    DOB: 08-Jan-2001, 20 y.o.   MRN: [id number]  Chief Complaint  Patient presents with  . Ear Fullness    HPI Patient is in today for bilateral ear fullness. She states this has been ongoing for several months and seems to be getting worse. She reports the symptoms fluctuate daily and affect the ears individually or bilaterally at times. She tells me the fullness seems to be worse at night and it has affected her hearing. She feels like her own voice is louder, but she has a hard time hearing others. She reports she also has chronic post nasal drip. She does not have any fever, chills, sinus pain or pressure, ear pain, sore throat, or rhinorrhea.   She tells me that her brother has significant allergies and is on daily medication. She has never taken oral medication for allergy symptoms.   History reviewed. No pertinent past medical history.  Past Surgical History:  Procedure Laterality Date  . NO PAST SURGERIES    . ROOT CANAL  10/2015    Family History  Problem Relation Age of Onset  . Hypertension Father   . Food Allergy Brother        peanut, shellfish, fish  . Allergic rhinitis Brother   . Cancer Maternal Grandmother        lung  . Hypertension Maternal Grandmother   . Asthma Cousin   . Eczema Cousin   . Angioedema Neg Hx   . Atopy Neg Hx   . Immunodeficiency Neg Hx   . Urticaria Neg Hx     Social History   Socioeconomic History  . Marital status: Single    Spouse name: Not on file  . Number of children: 0  . Years of education: Not on file  . Highest education level: Not on file  Occupational History  . Occupation: Consulting civil engineer  Tobacco Use  . Smoking status: Never Smoker  . Smokeless tobacco: Never Used  Vaping Use  . Vaping Use: Never used  Substance and Sexual Activity  . Alcohol use: No    Alcohol/week: 0.0 standard drinks  . Drug use: Yes  . Sexual activity: Yes    Birth  control/protection: Condom  Other Topics Concern  . Not on file  Social History Narrative   She currently lives with her mother starlet and her brother Forde Radon. Her parents are separated. Her mother is a Social research officer, government and her father is a Actuary. She currently attends [employer] and is in the 10th grade. She runs track in the fall in the spring and plans on being a fashion designer   Social Determinants of Health   Financial Resource Strain: Not on file  Food Insecurity: Not on file  Transportation Needs: Not on file  Physical Activity: Not on file  Stress: Not on file  Social Connections: Not on file  Intimate Partner Violence: Not on file    Outpatient Medications Prior to Visit  Medication Sig Dispense Refill  . escitalopram (LEXAPRO) 10 MG tablet Take 1 tablet (10 mg total) by mouth daily. 90 tablet 1  . fluticasone (FLONASE) 50 MCG/ACT nasal spray Place 1-2 sprays into both nostrils daily. 16 g 6  . TRI-LO-SPRINTEC 0.18/0.215/0.25 MG-25 MCG tab Take 1 tablet by mouth daily.     No facility-administered medications prior to visit.    No Known Allergies  Review of Systems All review of systems negative  except what is listed in the HPI     Objective:    Physical Exam Vitals and nursing note reviewed.  Constitutional:      Appearance: Normal appearance.  HENT:     Head: Normocephalic.     Right Ear: Ear canal and external ear normal. No drainage, swelling or tenderness. A middle ear effusion is present.     Left Ear: Ear canal and external ear normal. No drainage, swelling or tenderness. A middle ear effusion is present. Tympanic membrane is injected and bulging.     Ears:     Comments: Bilateral clear effusion present with injection and mild bulging noted in the left ear. Hearing appears intact, but we are in a quite environment and close.     Nose: Mucosal edema and congestion present.     Right Turbinates: Swollen and pale.     Left Turbinates: Swollen and  pale.     Right Sinus: No maxillary sinus tenderness or frontal sinus tenderness.     Left Sinus: No maxillary sinus tenderness or frontal sinus tenderness.     Mouth/Throat:     Mouth: Mucous membranes are moist.     Pharynx: Oropharynx is clear. Posterior oropharyngeal erythema present.     Comments: Cobblestoning present on the posterior oropharynx.  Eyes:     Extraocular Movements: Extraocular movements intact.     Conjunctiva/sclera: Conjunctivae normal.     Pupils: Pupils are equal, round, and reactive to light.  Cardiovascular:     Rate and Rhythm: Normal rate and regular rhythm.     Pulses: Normal pulses.     Heart sounds: Normal heart sounds.  Pulmonary:     Effort: Pulmonary effort is normal.     Breath sounds: Normal breath sounds.  Musculoskeletal:     Cervical back: Normal range of motion. No tenderness.  Lymphadenopathy:     Cervical: No cervical adenopathy.  Skin:    General: Skin is warm and dry.     Capillary Refill: Capillary refill takes less than 2 seconds.  Neurological:     General: No focal deficit present.     Mental Status: She is alert and oriented to person, place, and time.  Psychiatric:        Mood and Affect: Mood normal.        Behavior: Behavior normal.        Thought Content: Thought content normal.        Judgment: Judgment normal.     BP 112/70   Pulse 75   Temp 98.3 F (36.8 C)   Ht 5\' 2"  (1.575 m)   Wt 124 lb 12.8 oz (56.6 kg)   LMP 06/21/2020 (Exact Date)   SpO2 100%   BMI 22.83 kg/m  Wt Readings from Last 3 Encounters:  07/17/20 124 lb 12.8 oz (56.6 kg)  07/04/20 120 lb (54.4 kg)  06/05/20 119 lb (54 kg)    There are no preventive care reminders to display for this patient.  There are no preventive care reminders to display for this patient.   Lab Results  Component Value Date   TSH 2.39 01/21/2019   Lab Results  Component Value Date   WBC 4.0 01/21/2019   HGB 14.4 01/21/2019   HCT 43.8 01/21/2019   MCV 83.1  01/21/2019   PLT 281 01/21/2019   Lab Results  Component Value Date   NA 136 01/21/2019   K 4.4 01/21/2019   CO2 24 01/21/2019   GLUCOSE 81 01/21/2019  BUN 10 01/21/2019   CREATININE 0.79 01/21/2019   BILITOT 0.4 01/21/2019   AST 15 01/21/2019   ALT 10 01/21/2019   PROT 7.7 01/21/2019   CALCIUM 9.6 01/21/2019   No results found for: CHOL No results found for: HDL No results found for: LDLCALC No results found for: TRIG No results found for: CHOLHDL No results found for: VVOH6W     Assessment & Plan:   1. Ear fullness, bilateral 2. Chronic middle ear effusion, bilateral Symptoms consistent with eustachian tube dysfunction suspected to be resultant of allergy exacerbation. Turbinates pale and swollen with mild erythema and cobblestoning present to the posterior oropharynx. Bilateral effusions present with mild bulging and injection noted on the left.  Recommend continued use of fluticasone nasal spray and add oral antihistamine such as Claritin, Zyrtec, or Allegra to daily routine to see if this is helpful with symptoms.  Educated patient that symptoms may not resolve immediately and it may take up to 2 weeks to begin to notice improvement of symptoms.  Will place referral for ENT in the event that this treatment is not effective and she will need further evaluation.  Discussed with patient that she may cancel her ENT appointment if her symptoms improve with the current recommended treatment.  Follow-up if symptoms worsen or fail to improve.  - Ambulatory referral to ENT     Tollie Eth, NP

## 2020-08-02 ENCOUNTER — Ambulatory Visit (INDEPENDENT_AMBULATORY_CARE_PROVIDER_SITE_OTHER): Payer: Managed Care, Other (non HMO) | Admitting: Otolaryngology

## 2020-10-02 ENCOUNTER — Other Ambulatory Visit: Payer: Self-pay | Admitting: *Deleted

## 2020-10-02 DIAGNOSIS — F418 Other specified anxiety disorders: Secondary | ICD-10-CM

## 2020-10-02 MED ORDER — ESCITALOPRAM OXALATE 10 MG PO TABS: 10.0000 mg | ORAL_TABLET | Freq: Every day | ORAL | 1 refills | Status: DC

## 2020-12-05 ENCOUNTER — Other Ambulatory Visit: Payer: Self-pay

## 2020-12-05 ENCOUNTER — Ambulatory Visit: Payer: Managed Care, Other (non HMO) | Admitting: Family Medicine

## 2020-12-05 ENCOUNTER — Encounter: Payer: Self-pay | Admitting: Family Medicine

## 2020-12-05 VITALS — BP 122/70 | HR 95 | Ht 62.0 in | Wt 118.0 lb

## 2020-12-05 DIAGNOSIS — F321 Major depressive disorder, single episode, moderate: Secondary | ICD-10-CM

## 2020-12-05 DIAGNOSIS — R5383 Other fatigue: Secondary | ICD-10-CM | POA: Diagnosis not present

## 2020-12-05 DIAGNOSIS — R0683 Snoring: Secondary | ICD-10-CM

## 2020-12-05 NOTE — Assessment & Plan Note (Signed)
Discussed options.  She could decrease her Lexapro down to 5 mg daily for at least the next month to see if it still effective but may be having less sedation and eventually she could taper off just to rule this out as a potential cause.

## 2020-12-05 NOTE — Progress Notes (Signed)
Established Patient Office Visit  Subjective:  Patient ID: Jasmine Wyatt, female    DOB: Nov 03, 2000  Age: 20 y.o. MRN: [id number]  CC:  Chief Complaint  Patient presents with  . Fatigue    HPI Jasmine FISCHBACH presents for fatigue that has been going on for months.  This was addressed in December.  She is sleeping excessively.  Often times when he sleeps for about 12 hours and still does not feel well rested.  Often wakes up with a HA and sometime some chest discomfort. Can last a minute or two.    She said she had 1 day last week where she came home from work and fell asleep just a little bit after 5.  She says she does not remember falling asleep and did not wake up until 4:00 the next afternoon.  She says her mom was out of town and was so was not there to make sure that she wakes up.  She has to get her mom to purposely wake her up because she does not hear alarms go off etc.  Does report snoring.  She has a family history of sleep apnea in her father and paternal aunt.  He still taking her Lexapro regularly.  She does not feel like it is causing excess sedation but she is not sure.  She was planning on weaning down off of it last month but then had a stressful month and decided not to.  History reviewed. No pertinent past medical history.  Past Surgical History:  Procedure Laterality Date  . NO PAST SURGERIES    . ROOT CANAL  10/2015    Family History  Problem Relation Age of Onset  . Hypertension Father   . Food Allergy Brother        peanut, shellfish, fish  . Allergic rhinitis Brother   . Cancer Maternal Grandmother        lung  . Hypertension Maternal Grandmother   . Asthma Cousin   . Eczema Cousin   . Angioedema Neg Hx   . Atopy Neg Hx   . Immunodeficiency Neg Hx   . Urticaria Neg Hx     Social History   Socioeconomic History  . Marital status: Single    Spouse name: Not on file  . Number of children: 0  . Years of education: Not on file  . Highest  education level: Not on file  Occupational History  . Occupation: Consulting civil engineer  Tobacco Use  . Smoking status: Never Smoker  . Smokeless tobacco: Never Used  Vaping Use  . Vaping Use: Never used  Substance and Sexual Activity  . Alcohol use: No    Alcohol/week: 0.0 standard drinks  . Drug use: Yes  . Sexual activity: Yes    Birth control/protection: Condom  Other Topics Concern  . Not on file  Social History Narrative   She currently lives with her mother starlet and her brother Forde Radon. Her parents are separated. Her mother is a Social research officer, government and her father is a Actuary. She currently attends [employer] and is in the 10th grade. She runs track in the fall in the spring and plans on being a fashion designer   Social Determinants of Health   Financial Resource Strain: Not on file  Food Insecurity: Not on file  Transportation Needs: Not on file  Physical Activity: Not on file  Stress: Not on file  Social Connections: Not on file  Intimate Partner Violence: Not on  file    Outpatient Medications Prior to Visit  Medication Sig Dispense Refill  . escitalopram (LEXAPRO) 10 MG tablet Take 1 tablet (10 mg total) by mouth daily. 90 tablet 1  . fluticasone (FLONASE) 50 MCG/ACT nasal spray Place 1-2 sprays into both nostrils daily. 16 g 6  . TRI-LO-SPRINTEC 0.18/0.215/0.25 MG-25 MCG tab Take 1 tablet by mouth daily.     No facility-administered medications prior to visit.    No Known Allergies  ROS Review of Systems    Objective:    Physical Exam  BP 122/70   Pulse 95   Ht 5\' 2"  (1.575 m)   Wt 118 lb (53.5 kg)   LMP 11/17/2020 (Exact Date)   SpO2 100%   BMI 21.58 kg/m  Wt Readings from Last 3 Encounters:  12/05/20 118 lb (53.5 kg)  07/17/20 124 lb 12.8 oz (56.6 kg)  07/04/20 120 lb (54.4 kg)     Health Maintenance Due  Topic Date Due  . TETANUS/TDAP  09/19/2020  . COVID-19 Vaccine (3 - Booster for Pfizer series) 11/30/2020  . PAP-Cervical Cytology  Screening  10/30/2020  . PAP SMEAR-Modifier  10/30/2020    There are no preventive care reminders to display for this patient.  Lab Results  Component Value Date   TSH 2.39 01/21/2019   Lab Results  Component Value Date   WBC 4.0 01/21/2019   HGB 14.4 01/21/2019   HCT 43.8 01/21/2019   MCV 83.1 01/21/2019   PLT 281 01/21/2019   Lab Results  Component Value Date   NA 136 01/21/2019   K 4.4 01/21/2019   CO2 24 01/21/2019   GLUCOSE 81 01/21/2019   BUN 10 01/21/2019   CREATININE 0.79 01/21/2019   BILITOT 0.4 01/21/2019   AST 15 01/21/2019   ALT 10 01/21/2019   PROT 7.7 01/21/2019   CALCIUM 9.6 01/21/2019   No results found for: CHOL No results found for: HDL No results found for: LDLCALC No results found for: TRIG No results found for: CHOLHDL No results found for: 01/23/2019    Assessment & Plan:   Problem List Items Addressed This Visit      Other   Moderate major depression (HCC)    Discussed options.  She could decrease her Lexapro down to 5 mg daily for at least the next month to see if it still effective but may be having less sedation and eventually she could taper off just to rule this out as a potential cause.       Other Visit Diagnoses    Fatigue, unspecified type    -  Primary   Relevant Orders   Split night study   Snoring       Relevant Orders   Split night study     Fatigue-sounds like she is sleeping excessively and still does not wake up rested.  I do feel like she probably has some type of underlying sleep disorder.  We will start with a sleep study.  She also reports snoring so we will also check for sleep apnea.  Consider referral to a sleep specialist.  Still want to do the blood work that we ordered in January just to rule out anemia etc.  Consider possible medication side effect.  Epworth Sleepiness Scale of 19.  STOP-BANG score of 2 though she does report snoring.  No orders of the defined types were placed in this  encounter.   Follow-up: Return if symptoms worsen or fail to improve.  Beatrice Lecher, MD

## 2020-12-06 LAB — CBC WITH DIFFERENTIAL/PLATELET
Absolute Monocytes: 281 cells/uL (ref 200–950)
Basophils Absolute: 42 cells/uL (ref 0–200)
Basophils Relative: 0.8 %
Eosinophils Absolute: 62 cells/uL (ref 15–500)
Eosinophils Relative: 1.2 %
HCT: 41.2 % (ref 35.0–45.0)
Hemoglobin: 13.3 g/dL (ref 11.7–15.5)
Lymphs Abs: 2018 cells/uL (ref 850–3900)
MCH: 27.3 pg (ref 27.0–33.0)
MCHC: 32.3 g/dL (ref 32.0–36.0)
MCV: 84.4 fL (ref 80.0–100.0)
MPV: 9.3 fL (ref 7.5–12.5)
Monocytes Relative: 5.4 %
Neutro Abs: 2798 cells/uL (ref 1500–7800)
Neutrophils Relative %: 53.8 %
Platelets: 255 10*3/uL (ref 140–400)
RBC: 4.88 10*6/uL (ref 3.80–5.10)
RDW: 12.2 % (ref 11.0–15.0)
Total Lymphocyte: 38.8 %
WBC: 5.2 10*3/uL (ref 3.8–10.8)

## 2020-12-06 LAB — EPSTEIN-BARR VIRUS VCA ANTIBODY PANEL
EBV NA IgG: 165 U/mL — ABNORMAL HIGH
EBV VCA IgG: >750 U/mL — ABNORMAL HIGH
EBV VCA IgM: <36 U/mL

## 2020-12-06 LAB — FERRITIN: Ferritin: 10 ng/mL — ABNORMAL LOW (ref 16–154)

## 2020-12-06 LAB — TSH: TSH: 1.47 mIU/L

## 2020-12-06 LAB — CMV ABS, IGG+IGM (CYTOMEGALOVIRUS)
CMV IgM: <30 AU/mL
Cytomegalovirus Ab-IgG: <0.6 U/mL

## 2020-12-14 ENCOUNTER — Telehealth: Payer: Self-pay | Admitting: Family Medicine

## 2020-12-14 ENCOUNTER — Encounter: Payer: Self-pay | Admitting: Family Medicine

## 2020-12-14 NOTE — Telephone Encounter (Signed)
This has been handled through [employer].

## 2020-12-14 NOTE — Telephone Encounter (Signed)
Will need consult with hematology to get the iron infusion.  We can place 1 more urgently to her [place] location.  Also okay for work note

## 2020-12-14 NOTE — Telephone Encounter (Signed)
Pt called and states that Dr.Metheney told her that her iron was low and patient would like to just go with a referral to get a iron infusion. Please advise pt

## 2020-12-21 ENCOUNTER — Other Ambulatory Visit: Payer: Self-pay | Admitting: *Deleted

## 2020-12-21 DIAGNOSIS — R5383 Other fatigue: Secondary | ICD-10-CM

## 2020-12-21 DIAGNOSIS — E611 Iron deficiency: Secondary | ICD-10-CM

## 2021-01-18 ENCOUNTER — Telehealth (INDEPENDENT_AMBULATORY_CARE_PROVIDER_SITE_OTHER): Payer: Self-pay | Admitting: Family Medicine

## 2021-01-18 DIAGNOSIS — Z5329 Procedure and treatment not carried out because of patient's decision for other reasons: Secondary | ICD-10-CM

## 2021-01-18 DIAGNOSIS — Z91199 Patient's noncompliance with other medical treatment and regimen due to unspecified reason: Secondary | ICD-10-CM

## 2021-01-18 NOTE — Progress Notes (Signed)
   Attempted to connect virtually as well as by phone.

## 2021-01-18 NOTE — Progress Notes (Signed)
LVM

## 2021-01-22 ENCOUNTER — Telehealth (INDEPENDENT_AMBULATORY_CARE_PROVIDER_SITE_OTHER): Payer: Self-pay | Admitting: Family Medicine

## 2021-01-22 DIAGNOSIS — Z91199 Patient's noncompliance with other medical treatment and regimen due to unspecified reason: Secondary | ICD-10-CM

## 2021-01-22 DIAGNOSIS — Z5329 Procedure and treatment not carried out because of patient's decision for other reasons: Secondary | ICD-10-CM

## 2021-01-22 NOTE — Progress Notes (Signed)
LVM. Advising pt that I was calling to get her prescreening before her visit with Dr. Linford Arnold.  2nd attempt. Advised her to log into mychart so that Dr. Linford Arnold would know that she was ready

## 2021-01-22 NOTE — Progress Notes (Signed)
LM on cell phone.    Nani Gasser, MD

## 2021-01-23 ENCOUNTER — Other Ambulatory Visit: Payer: Self-pay | Admitting: Family

## 2021-01-23 DIAGNOSIS — D509 Iron deficiency anemia, unspecified: Secondary | ICD-10-CM

## 2021-01-24 ENCOUNTER — Other Ambulatory Visit: Payer: Self-pay

## 2021-01-24 ENCOUNTER — Encounter: Payer: Self-pay | Admitting: Family

## 2021-01-24 ENCOUNTER — Inpatient Hospital Stay: Payer: Managed Care, Other (non HMO) | Attending: Family

## 2021-01-24 ENCOUNTER — Inpatient Hospital Stay (HOSPITAL_BASED_OUTPATIENT_CLINIC_OR_DEPARTMENT_OTHER): Payer: Managed Care, Other (non HMO) | Admitting: Family

## 2021-01-24 VITALS — BP 100/57 | HR 88 | Temp 98.6°F | Resp 18 | Ht 62.0 in | Wt 118.0 lb

## 2021-01-24 DIAGNOSIS — E611 Iron deficiency: Secondary | ICD-10-CM | POA: Insufficient documentation

## 2021-01-24 DIAGNOSIS — D509 Iron deficiency anemia, unspecified: Secondary | ICD-10-CM

## 2021-01-24 DIAGNOSIS — R55 Syncope and collapse: Secondary | ICD-10-CM | POA: Insufficient documentation

## 2021-01-24 DIAGNOSIS — Z79899 Other long term (current) drug therapy: Secondary | ICD-10-CM | POA: Insufficient documentation

## 2021-01-24 DIAGNOSIS — R5383 Other fatigue: Secondary | ICD-10-CM | POA: Insufficient documentation

## 2021-01-24 DIAGNOSIS — R0789 Other chest pain: Secondary | ICD-10-CM | POA: Insufficient documentation

## 2021-01-24 LAB — CBC WITH DIFFERENTIAL (CANCER CENTER ONLY)
Abs Immature Granulocytes: 0.01 10*3/uL (ref 0.00–0.07)
Basophils Absolute: 0 10*3/uL (ref 0.0–0.1)
Basophils Relative: 0 %
Eosinophils Absolute: 0 10*3/uL (ref 0.0–0.5)
Eosinophils Relative: 1 %
HCT: 38.4 % (ref 36.0–46.0)
Hemoglobin: 12.7 g/dL (ref 12.0–15.0)
Immature Granulocytes: 0 %
Lymphocytes Relative: 42 %
Lymphs Abs: 1.9 10*3/uL (ref 0.7–4.0)
MCH: 28 pg (ref 26.0–34.0)
MCHC: 33.1 g/dL (ref 30.0–36.0)
MCV: 84.8 fL (ref 80.0–100.0)
Monocytes Absolute: 0.2 10*3/uL (ref 0.1–1.0)
Monocytes Relative: 4 %
Neutro Abs: 2.4 10*3/uL (ref 1.7–7.7)
Neutrophils Relative %: 53 %
Platelet Count: 245 10*3/uL (ref 150–400)
RBC: 4.53 MIL/uL (ref 3.87–5.11)
RDW: 12.1 % (ref 11.5–15.5)
WBC Count: 4.6 10*3/uL (ref 4.0–10.5)
nRBC: 0 % (ref 0.0–0.2)

## 2021-01-24 LAB — RETICULOCYTES
Immature Retic Fract: 6.1 % (ref 2.3–15.9)
RBC.: 4.51 MIL/uL (ref 3.87–5.11)
Retic Count, Absolute: 46 10*3/uL (ref 19.0–186.0)
Retic Ct Pct: 1 % (ref 0.4–3.1)

## 2021-01-24 LAB — CMP (CANCER CENTER ONLY)
ALT: 12 U/L (ref 0–44)
AST: 19 U/L (ref 15–41)
Albumin: 4.1 g/dL (ref 3.5–5.0)
Alkaline Phosphatase: 47 U/L (ref 38–126)
Anion gap: 6 (ref 5–15)
BUN: 8 mg/dL (ref 6–20)
CO2: 28 mmol/L (ref 22–32)
Calcium: 9.7 mg/dL (ref 8.9–10.3)
Chloride: 102 mmol/L (ref 98–111)
Creatinine: 0.86 mg/dL (ref 0.44–1.00)
GFR, Estimated: >60 mL/min (ref >60)
Glucose, Bld: 89 mg/dL (ref 70–99)
Potassium: 3.5 mmol/L (ref 3.5–5.1)
Sodium: 136 mmol/L (ref 135–145)
Total Bilirubin: 0.4 mg/dL (ref 0.3–1.2)
Total Protein: 7.4 g/dL (ref 6.5–8.1)

## 2021-01-24 LAB — LACTATE DEHYDROGENASE: LDH: 170 U/L (ref 98–192)

## 2021-01-24 LAB — SAVE SMEAR(SSMR), FOR PROVIDER SLIDE REVIEW

## 2021-01-24 NOTE — Progress Notes (Signed)
Hematology/Oncology Consultation   Name: Jasmine Wyatt      MRN: [id number]    Location: Room/bed info not found  Date: 01/24/2021 Time:3:41 PM   REFERRING PHYSICIAN: Nani Gasser, MD  REASON FOR CONSULT:  Low iron, fatigue   DIAGNOSIS: Iron deficiency  HISTORY OF PRESENT ILLNESS: Jasmine Wyatt is a very pleasant 20 yo African American female with recent iron deficiency. This was checked as part of her work up for extreme fatigue.  Her ferritin in May was only 10. Hgb has remained stable at 12.7, MCV 84, platelets 245 and WBC count 4.6.  Her cycle is regular and not heavy.  She recently fell asleep without recollection of falling asleep at 5 pm and woke up the next day at 4 pm.  She had a near syncopal episode at work 2 months ago. She states that her symptoms continue to worsen.  Her fatigue persists and also notes palpitations and mid sternal chest discomfort in the morning when she first wakes up that resolves once she gets up and moves around.  She has been referred for sleep study to assess for any undiagnosed sleep disorders.  Her father and paternal aunt both have sleep apnea.  Her mother has history of anemia and IV iron infusions.  No known history of sickle cell disease or trait.  No personal history of cancer. She states that her maternal grandmother had liver cancer and her maternal aunt had an unknown primary.  No history of diabetes or thyroid disease.  No fever, chills, n/v, cough, rash, SOB, chest pain, palpitations, abdominal pain or changes in bowel or bladder habits.  She has occasional night sweats.  No smoking or recreational drug use.  She has the rare alcoholic beverage socially.  She has maintained a good appetite but feels she needs to better hydrate throughout the day. Her weight is stable at 118 lbs.  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   ROS: All other 10 point review of systems is negative.    PAST MEDICAL HISTORY:   No past medical history on file.  ALLERGIES: No Known Allergies    MEDICATIONS:  Current Outpatient Medications on File Prior to Visit  Medication Sig Dispense Refill   escitalopram (LEXAPRO) 10 MG tablet Take 1 tablet (10 mg total) by mouth daily. 90 tablet 1   No current facility-administered medications on file prior to visit.     PAST SURGICAL HISTORY Past Surgical History:  Procedure Laterality Date   NO PAST SURGERIES     ROOT CANAL  10/2015    FAMILY HISTORY: Family History  Problem Relation Age of Onset   Hypertension Father    Food Allergy Brother        peanut, shellfish, fish   Allergic rhinitis Brother    Cancer Maternal Grandmother        lung   Hypertension Maternal Grandmother    Asthma Cousin    Eczema Cousin    Angioedema Neg Hx    Atopy Neg Hx    Immunodeficiency Neg Hx    Urticaria Neg Hx     SOCIAL HISTORY:  reports that she has never smoked. She has never used smokeless tobacco. She reports current drug use. She reports that she does not drink alcohol.  PERFORMANCE STATUS: The patient's performance status is 1 - Symptomatic but completely ambulatory  PHYSICAL EXAM: Most Recent Vital Signs: Blood pressure (!) 100/57, pulse 88,  temperature 98.6 F (37 C), temperature source Oral, resp. rate 18, height 5\' 2"  (1.575 m), weight 118 lb (53.5 kg), SpO2 100 %. BP (!) 100/57 (BP Location: Left Arm, Patient Position: Sitting)   Pulse 88   Temp 98.6 F (37 C) (Oral)   Resp 18   Ht 5\' 2"  (1.575 m)   Wt 118 lb (53.5 kg)   SpO2 100%   BMI 21.58 kg/m   General Appearance:    Alert, cooperative, no distress, appears stated age  Head:    Normocephalic, without obvious abnormality, atraumatic  Eyes:    PERRL, conjunctiva/corneas clear, EOM's intact, fundi    benign, both eyes        Throat:   Lips, mucosa, and tongue normal; teeth and gums normal  Neck:   Supple, symmetrical, trachea midline, no adenopathy;    thyroid:   no enlargement/tenderness/nodules; no carotid   bruit or JVD  Back:     Symmetric, no curvature, ROM normal, no CVA tenderness  Lungs:     Clear to auscultation bilaterally, respirations unlabored  Chest Wall:    No tenderness or deformity   Heart:    Regular rate and rhythm, S1 and S2 normal, no murmur, rub   or gallop     Abdomen:     Soft, non-tender, bowel sounds active all four quadrants,    no masses, no organomegaly        Extremities:   Extremities normal, atraumatic, no cyanosis or edema  Pulses:   2+ and symmetric all extremities  Skin:   Skin color, texture, turgor normal, no rashes or lesions  Lymph nodes:   Cervical, supraclavicular, and axillary nodes normal  Neurologic:   CNII-XII intact, normal strength, sensation and reflexes    throughout    LABORATORY DATA:  Results for orders placed or performed in visit on 01/24/21 (from the past 48 hour(s))  CBC with Differential (Cancer Center Only)     Status: None   Collection Time: 01/24/21  3:00 PM  Result Value Ref Range   WBC Count 4.6 4.0 - 10.5 K/uL   RBC 4.53 3.87 - 5.11 MIL/uL   Hemoglobin 12.7 12.0 - 15.0 g/dL   HCT 01/26/21 01/26/21 - 93.2 %   MCV 84.8 80.0 - 100.0 fL   MCH 28.0 26.0 - 34.0 pg   MCHC 33.1 30.0 - 36.0 g/dL   RDW 67.1 24.5 - 80.9 %   Platelet Count 245 150 - 400 K/uL   nRBC 0.0 0.0 - 0.2 %   Neutrophils Relative % 53 %   Neutro Abs 2.4 1.7 - 7.7 K/uL   Lymphocytes Relative 42 %   Lymphs Abs 1.9 0.7 - 4.0 K/uL   Monocytes Relative 4 %   Monocytes Absolute 0.2 0.1 - 1.0 K/uL   Eosinophils Relative 1 %   Eosinophils Absolute 0.0 0.0 - 0.5 K/uL   Basophils Relative 0 %   Basophils Absolute 0.0 0.0 - 0.1 K/uL   WBC Morphology OCC VARIANT LYMPH    Immature Granulocytes 0 %   Abs Immature Granulocytes 0.01 0.00 - 0.07 K/uL   Ovalocytes PRESENT     Comment: Performed at [facility] at [facility], [address] [place], [address] [place]  CMP (Cancer Center only)      Status: None   Collection Time: 01/24/21  3:00 PM  Result Value Ref Range   Sodium 136 135 - 145 mmol/L   Potassium 3.5 3.5 -  5.1 mmol/L   Chloride 102 98 - 111 mmol/L   CO2 28 22 - 32 mmol/L   Glucose, Bld 89 70 - 99 mg/dL    Comment: Glucose reference range applies only to samples taken after fasting for at least 8 hours.   BUN 8 6 - 20 mg/dL   Creatinine 5.63 8.75 - 1.00 mg/dL   Calcium 9.7 8.9 - 64.3 mg/dL   Total Protein 7.4 6.5 - 8.1 g/dL   Albumin 4.1 3.5 - 5.0 g/dL   AST 19 15 - 41 U/L   ALT 12 0 - 44 U/L   Alkaline Phosphatase 47 38 - 126 U/L   Total Bilirubin 0.4 0.3 - 1.2 mg/dL   GFR, Estimated >32 >95 mL/min    Comment: (NOTE) Calculated using the CKD-EPI Creatinine Equation (2021)    Anion gap 6 5 - 15    Comment: Performed at [facility] at [facility], [address], [place], [place] [address]  Reticulocytes     Status: None   Collection Time: 01/24/21  3:00 PM  Result Value Ref Range   Retic Ct Pct 1.0 0.4 - 3.1 %   RBC. 4.51 3.87 - 5.11 MIL/uL   Retic Count, Absolute 46.0 19.0 - 186.0 K/uL   Immature Retic Fract 6.1 2.3 - 15.9 %    Comment: Performed at [facility] at [facility], [address] [place], [place] [address]  Save Smear [facility])     Status: None   Collection Time: 01/24/21  3:00 PM  Result Value Ref Range   Smear Review SMEAR STAINED AND AVAILABLE FOR REVIEW     Comment: Performed at [facility] Lab at [facility], [address] [place], [place] [address]      RADIOGRAPHY: No results found.     PATHOLOGY: None  ASSESSMENT/PLAN: Jasmine Wyatt is a very pleasant 20 yo African American female with recent iron deficiency. She is symptomatic as mentioned above.  Iron studies are pending. We will get her set up for IV iron if needed.  Follow-up in 6 weeks.   All questions were answered. The patient knows to call the clinic with any problems, questions or concerns. We  can certainly see the patient much sooner if necessary.  The patient was discussed with Dr. Myna Hidalgo and he is in agreement with the aforementioned.   Emeline Gins, NP

## 2021-01-25 ENCOUNTER — Telehealth: Payer: Self-pay | Admitting: *Deleted

## 2021-01-25 NOTE — Telephone Encounter (Signed)
Per 01/24/21 los - called and gave upcoming appointments - patient confirmed - view mychart

## 2021-01-26 ENCOUNTER — Other Ambulatory Visit: Payer: Self-pay | Admitting: Family

## 2021-01-26 ENCOUNTER — Telehealth: Payer: Self-pay | Admitting: *Deleted

## 2021-01-26 DIAGNOSIS — D509 Iron deficiency anemia, unspecified: Secondary | ICD-10-CM | POA: Insufficient documentation

## 2021-01-26 DIAGNOSIS — D500 Iron deficiency anemia secondary to blood loss (chronic): Secondary | ICD-10-CM

## 2021-01-26 LAB — FERRITIN: Ferritin: 9 ng/mL — ABNORMAL LOW (ref 11–307)

## 2021-01-26 LAB — IRON AND TIBC
Iron: 57 ug/dL (ref 28–170)
Saturation Ratios: 14 % (ref 10.4–31.8)
TIBC: 407 ug/dL (ref 250–450)
UIBC: 350 ug/dL

## 2021-01-26 NOTE — Telephone Encounter (Signed)
Per scheduling message Maralyn Sago 01/26/21 called and gave patient upcoming appointments (2) doses of IV Iron - patient confirmed

## 2021-01-30 ENCOUNTER — Ambulatory Visit: Payer: Managed Care, Other (non HMO)

## 2021-01-31 ENCOUNTER — Inpatient Hospital Stay: Payer: Managed Care, Other (non HMO)

## 2021-01-31 ENCOUNTER — Other Ambulatory Visit: Payer: Self-pay

## 2021-01-31 VITALS — BP 108/69 | HR 93 | Temp 98.1°F | Resp 17

## 2021-01-31 DIAGNOSIS — D500 Iron deficiency anemia secondary to blood loss (chronic): Secondary | ICD-10-CM

## 2021-01-31 DIAGNOSIS — E611 Iron deficiency: Secondary | ICD-10-CM | POA: Diagnosis not present

## 2021-01-31 MED ORDER — SODIUM CHLORIDE 0.9 % IV SOLN
125.0000 mg | Freq: Once | INTRAVENOUS | Status: AC
  Administered 2021-01-31: 125 mg via INTRAVENOUS
  Filled 2021-01-31: qty 125

## 2021-01-31 MED ORDER — SODIUM CHLORIDE 0.9 % IV SOLN
Freq: Once | INTRAVENOUS | Status: AC
  Filled 2021-01-31: qty 250

## 2021-01-31 NOTE — Patient Instructions (Signed)
Sodium Ferric Gluconate Complex injection What is this medication? SODIUM FERRIC GLUCONATE COMPLEX (SOE dee um FER ik GLOO koe nate KOM pleks) is an iron replacement. It is used with epoetin therapy to treat low iron levelsin patients who are receiving hemodialysis. This medicine may be used for other purposes; ask your health care provider orpharmacist if you have questions. COMMON BRAND NAME(S): Ferrlecit, Nulecit What should I tell my care team before I take this medication? They need to know if you have any of the following conditions: anemia that is not from iron deficiency high levels of iron in the body an unusual or allergic reaction to iron, benzyl alcohol, other medicines, foods, dyes, or preservatives pregnant or are trying to become pregnant breast-feeding How should I use this medication? This medicine is for infusion into a vein. It is given by a health careprofessional in a hospital or clinic setting. Talk to your pediatrician regarding the use of this medicine in children. While this drug may be prescribed for children as young as 6 years old for selectedconditions, precautions do apply. Overdosage: If you think you have taken too much of this medicine contact apoison control center or emergency room at once. NOTE: This medicine is only for you. Do not share this medicine with others. What if I miss a dose? It is important not to miss your dose. Call your doctor or health careprofessional if you are unable to keep an appointment. What may interact with this medication? Do not take this medicine with any of the following medications: deferoxamine dimercaprol other iron products This medicine may also interact with the following medications: chloramphenicol deferasirox medicine for blood pressure like enalapril This list may not describe all possible interactions. Give your health care provider a list of all the medicines, herbs, non-prescription drugs, or dietary  supplements you use. Also tell them if you smoke, drink alcohol, or use illegaldrugs. Some items may interact with your medicine. What should I watch for while using this medication? Your condition will be monitored carefully while you are receiving thismedicine. Visit your doctor for check-ups as directed. What side effects may I notice from receiving this medication? Side effects that you should report to your doctor or health care professionalas soon as possible: allergic reactions like skin rash, itching or hives, swelling of the face, lips, or tongue breathing problems changes in hearing changes in vision chills, flushing, or sweating fast, irregular heartbeat feeling faint or lightheaded, falls fever, flu-like symptoms high or low blood pressure pain, tingling, numbness in the hands or feet severe pain in the chest, back, flanks, or groin swelling of the ankles, feet, hands trouble passing urine or change in the amount of urine unusually weak or tired Side effects that usually do not require medical attention (report to yourdoctor or health care professional if they continue or are bothersome): cramps dark colored stools diarrhea headache nausea, vomiting stomach upset This list may not describe all possible side effects. Call your doctor for medical advice about side effects. You may report side effects to FDA at1-800-FDA-1088. Where should I keep my medication? This drug is given in a hospital or clinic and will not be stored at home. NOTE: This sheet is a summary. It may not cover all possible information. If you have questions about this medicine, talk to your doctor, pharmacist, orhealth care provider.  2022 Elsevier/Gold Standard (2008-02-24 15:58:57)  

## 2021-02-06 ENCOUNTER — Inpatient Hospital Stay: Payer: Managed Care, Other (non HMO) | Attending: Hematology & Oncology

## 2021-02-06 DIAGNOSIS — Z8249 Family history of ischemic heart disease and other diseases of the circulatory system: Secondary | ICD-10-CM | POA: Insufficient documentation

## 2021-02-06 DIAGNOSIS — R5383 Other fatigue: Secondary | ICD-10-CM | POA: Insufficient documentation

## 2021-02-06 DIAGNOSIS — D509 Iron deficiency anemia, unspecified: Secondary | ICD-10-CM | POA: Insufficient documentation

## 2021-02-06 DIAGNOSIS — Z79899 Other long term (current) drug therapy: Secondary | ICD-10-CM | POA: Insufficient documentation

## 2021-02-06 DIAGNOSIS — Z840 Family history of diseases of the skin and subcutaneous tissue: Secondary | ICD-10-CM | POA: Insufficient documentation

## 2021-02-06 DIAGNOSIS — Z801 Family history of malignant neoplasm of trachea, bronchus and lung: Secondary | ICD-10-CM | POA: Insufficient documentation

## 2021-02-06 DIAGNOSIS — Z836 Family history of other diseases of the respiratory system: Secondary | ICD-10-CM | POA: Insufficient documentation

## 2021-02-09 ENCOUNTER — Inpatient Hospital Stay: Payer: Managed Care, Other (non HMO)

## 2021-02-09 ENCOUNTER — Other Ambulatory Visit: Payer: Self-pay

## 2021-02-09 VITALS — BP 97/67 | HR 77 | Temp 98.0°F

## 2021-02-09 DIAGNOSIS — Z836 Family history of other diseases of the respiratory system: Secondary | ICD-10-CM | POA: Diagnosis not present

## 2021-02-09 DIAGNOSIS — D509 Iron deficiency anemia, unspecified: Secondary | ICD-10-CM | POA: Diagnosis not present

## 2021-02-09 DIAGNOSIS — Z8249 Family history of ischemic heart disease and other diseases of the circulatory system: Secondary | ICD-10-CM | POA: Diagnosis not present

## 2021-02-09 DIAGNOSIS — R5383 Other fatigue: Secondary | ICD-10-CM | POA: Diagnosis not present

## 2021-02-09 DIAGNOSIS — Z840 Family history of diseases of the skin and subcutaneous tissue: Secondary | ICD-10-CM | POA: Diagnosis not present

## 2021-02-09 DIAGNOSIS — Z801 Family history of malignant neoplasm of trachea, bronchus and lung: Secondary | ICD-10-CM | POA: Diagnosis not present

## 2021-02-09 DIAGNOSIS — Z79899 Other long term (current) drug therapy: Secondary | ICD-10-CM | POA: Diagnosis not present

## 2021-02-09 DIAGNOSIS — D500 Iron deficiency anemia secondary to blood loss (chronic): Secondary | ICD-10-CM

## 2021-02-09 MED ORDER — NA FERRIC GLUC CPLX IN SUCROSE 12.5 MG/ML IV SOLN
125.0000 mg | Freq: Once | INTRAVENOUS | Status: AC
  Administered 2021-02-09: 125 mg via INTRAVENOUS
  Filled 2021-02-09: qty 125

## 2021-02-09 MED ORDER — SODIUM CHLORIDE 0.9 % IV SOLN
Freq: Once | INTRAVENOUS | Status: AC
  Filled 2021-02-09: qty 250

## 2021-02-09 NOTE — Patient Instructions (Signed)
Ferumoxytol injection What is this medication? FERUMOXYTOL is an iron complex. Iron is used to make healthy red blood cells, which carry oxygen and nutrients throughout the body. This medicine is used totreat iron deficiency anemia. This medicine may be used for other purposes; ask your health care provider orpharmacist if you have questions. COMMON BRAND NAME(S): Feraheme What should I tell my care team before I take this medication? They need to know if you have any of these conditions: anemia not caused by low iron levels high levels of iron in the blood magnetic resonance imaging (MRI) test scheduled an unusual or allergic reaction to iron, other medicines, foods, dyes, or preservatives pregnant or trying to get pregnant breast-feeding How should I use this medication? This medicine is for injection into a vein. It is given by a health careprofessional in a hospital or clinic setting. Talk to your pediatrician regarding the use of this medicine in children.Special care may be needed. Overdosage: If you think you have taken too much of this medicine contact apoison control center or emergency room at once. NOTE: This medicine is only for you. Do not share this medicine with others. What if I miss a dose? It is important not to miss your dose. Call your doctor or health careprofessional if you are unable to keep an appointment. What may interact with this medication? This medicine may interact with the following medications: other iron products This list may not describe all possible interactions. Give your health care provider a list of all the medicines, herbs, non-prescription drugs, or dietary supplements you use. Also tell them if you smoke, drink alcohol, or use illegaldrugs. Some items may interact with your medicine. What should I watch for while using this medication? Visit your doctor or healthcare professional regularly. Tell your doctor or healthcare professional if your  symptoms do not start to get better or if theyget worse. You may need blood work done while you are taking this medicine. You may need to follow a special diet. Talk to your doctor. Foods that contain iron include: whole grains/cereals, dried fruits, beans, or peas, leafy greenvegetables, and organ meats (liver, kidney). What side effects may I notice from receiving this medication? Side effects that you should report to your doctor or health care professionalas soon as possible: allergic reactions like skin rash, itching or hives, swelling of the face, lips, or tongue breathing problems changes in blood pressure feeling faint or lightheaded, falls fever or chills flushing, sweating, or hot feelings swelling of the ankles or feet Side effects that usually do not require medical attention (report to yourdoctor or health care professional if they continue or are bothersome): diarrhea headache nausea, vomiting stomach pain This list may not describe all possible side effects. Call your doctor for medical advice about side effects. You may report side effects to FDA at1-800-FDA-1088. Where should I keep my medication? This drug is given in a hospital or clinic and will not be stored at home. NOTE: This sheet is a summary. It may not cover all possible information. If you have questions about this medicine, talk to your doctor, pharmacist, orhealth care provider.  2022 Elsevier/Gold Standard (2016-08-12 20:21:10)  

## 2021-02-25 ENCOUNTER — Ambulatory Visit (HOSPITAL_BASED_OUTPATIENT_CLINIC_OR_DEPARTMENT_OTHER): Payer: Managed Care, Other (non HMO) | Attending: Family Medicine | Admitting: Internal Medicine

## 2021-02-26 ENCOUNTER — Telehealth: Payer: Managed Care, Other (non HMO) | Admitting: Physician Assistant

## 2021-02-26 DIAGNOSIS — K047 Periapical abscess without sinus: Secondary | ICD-10-CM | POA: Diagnosis not present

## 2021-02-26 MED ORDER — AMOXICILLIN 500 MG PO CAPS: 500.0000 mg | ORAL_CAPSULE | Freq: Two times a day (BID) | ORAL | 0 refills | Status: AC

## 2021-02-26 NOTE — Progress Notes (Signed)
Virtual Visit Consent   [name], you are scheduled for a virtual visit with a [facility] provider today.     Just as with appointments in the office, your consent must be obtained to participate.  Your consent will be active for this visit and any virtual visit you may have with one of our providers in the next 365 days.     If you have a MyChart account, a copy of this consent can be sent to you electronically.  All virtual visits are billed to your insurance company just like a traditional visit in the office.    As this is a virtual visit, video technology does not allow for your provider to perform a traditional examination.  This may limit your provider's ability to fully assess your condition.  If your provider identifies any concerns that need to be evaluated in person or the need to arrange testing (such as labs, EKG, etc.), we will make arrangements to do so.     Although advances in technology are sophisticated, we cannot ensure that it will always work on either your end or our end.  If the connection with a video visit is poor, the visit may have to be switched to a telephone visit.  With either a video or telephone visit, we are not always able to ensure that we have a secure connection.     I need to obtain your verbal consent now.   Are you willing to proceed with your visit today?    Jasmine Wyatt has provided verbal consent on 02/26/2021 for a virtual visit (video or telephone).   Margaretann Loveless, PA-C   Date: 02/26/2021 8:52 AM   Virtual Visit via Video Note   I, Margaretann Loveless, connected with  Jasmine Wyatt  ([id number], 2000/09/06) on 02/26/21 at  8:45 AM EDT by a video-enabled telemedicine application and verified that I am speaking with the correct person using two identifiers.  Location: Patient: Virtual Visit Location Patient: Home Provider: Virtual Visit Location Provider: Home Office   I discussed the limitations of evaluation and management  by telemedicine and the availability of in person appointments. The patient expressed understanding and agreed to proceed.    History of Present Illness: Jasmine Wyatt is a 20 y.o. who identifies as a female who was assigned female at birth, and is being seen today for tooth abscess. Has noted swelling and pain around the wisdom tooth on the left side. Having difficulty with eating and drinking due to pain and sensitivity. Lymph nodes on same side are swollen and tender. Gum is so swollen on the left it is pushing on her tongue. Having difficulty biting down and closing mouth due to pain and swelling.    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   Depression with anxiety 12/30/2019   Acne 09/16/2019   Eczema 06/21/2019   Encounter for counseling regarding contraception 12/22/2018   Angioedema 12/20/2015   Seasonal and perennial allergic rhinoconjunctivitis 12/20/2015    Allergies: No Known Allergies Medications:  Current Outpatient Medications:    amoxicillin (AMOXIL) 500 MG capsule, Take 1 capsule (500 mg total) by mouth 2 (two) times daily for 10 days., Disp: 20 capsule, Rfl: 0   escitalopram (LEXAPRO) 10 MG tablet, Take 1 tablet (10 mg total) by mouth daily., Disp: 90 tablet, Rfl: 1  Observations/Objective: Patient is well-developed,  well-nourished in no acute distress.  Resting comfortably at home.  Head is normocephalic, atraumatic.  No labored breathing.  Speech is clear and coherent with logical content.  Patient is alert and oriented at baseline.  Mild swelling noted in left lower jaw  Assessment and Plan: 1. Tooth abscess - amoxicillin (AMOXIL) 500 MG capsule; Take 1 capsule (500 mg total) by mouth 2 (two) times daily for 10 days.  Dispense: 20 capsule; Refill: 0  - Suspected tooth abscess or impacted wisdom tooth trying to emerge - Amoxicillin prescribed - May  use ibuprofen, tylenol and oragel for pain - Salt water gargles can help with inflammation - Ice to outside of cheek - Patient does have dentist but booked out; list of emergency dentist supplied via AVS - Push fluids for hydration - Rest as needed - Follow up with dentist asap  Follow Up Instructions: I discussed the assessment and treatment plan with the patient. The patient was provided an opportunity to ask questions and all were answered. The patient agreed with the plan and demonstrated an understanding of the instructions.  A copy of instructions were sent to the patient via MyChart.  The patient was advised to call back or seek an in-person evaluation if the symptoms worsen or if the condition fails to improve as anticipated.  Time:  I spent 14 minutes with the patient via telehealth technology discussing the above problems/concerns.    Margaretann Loveless, PA-C

## 2021-02-26 NOTE — Patient Instructions (Signed)
Jasmine Wyatt, thank you for joining Margaretann Loveless, PA-C for today's virtual visit.  While this provider is not your primary care provider (PCP), if your PCP is located in our provider database this encounter information will be shared with them immediately following your visit.  Consent: (Patient) Jasmine Wyatt provided verbal consent for this virtual visit at the beginning of the encounter.  Current Medications:  Current Outpatient Medications:    amoxicillin (AMOXIL) 500 MG capsule, Take 1 capsule (500 mg total) by mouth 2 (two) times daily for 10 days., Disp: 20 capsule, Rfl: 0   escitalopram (LEXAPRO) 10 MG tablet, Take 1 tablet (10 mg total) by mouth daily., Disp: 90 tablet, Rfl: 1   Medications ordered in this encounter:  Meds ordered this encounter  Medications   amoxicillin (AMOXIL) 500 MG capsule    Sig: Take 1 capsule (500 mg total) by mouth 2 (two) times daily for 10 days.    Dispense:  20 capsule    Refill:  0    Order Specific Question:   Supervising Provider    Answer:   Hyacinth Meeker, BRIAN [[id number]]     *If you need refills on other medications prior to your next appointment, please contact your pharmacy*  Follow-Up: Call back or seek an in-person evaluation if the symptoms worsen or if the condition fails to improve as anticipated.  Other Instructions - Suspected tooth abscess or impacted wisdom tooth trying to emerge - Amoxicillin prescribed - May use ibuprofen, tylenol and oragel for pain - Salt water gargles can help with inflammation - Ice to outside of cheek - Patient does have dentist but booked out; list of emergency dentist supplied via AVS - Push fluids for hydration - Rest as needed - Follow up with dentist asap  Dental Abscess  A dental abscess is an area of pus in or around a tooth. It comes from an infection. It can cause pain and other symptoms. Treatment will help withsymptoms and prevent the infection from spreading. Follow these  instructions at home: Medicines Take over-the-counter and prescription medicines only as told by your dentist. If you were prescribed an antibiotic medicine, take it as told by your dentist. Do not stop taking it even if you start to feel better. If you were prescribed a gel that has numbing medicine in it, use it exactly as told. Do not drive or use heavy machinery (like a Surveyor, mining) while taking prescription pain medicine. General instructions  Rinse out your mouth often with salt water. To make salt water, dissolve -1 tsp of salt in 1 cup of warm water. Eat a soft diet while your mouth is healing. Drink enough fluid to keep your urine pale yellow. Do not apply heat to the outside of your mouth. Do not use any products that contain nicotine or tobacco. These include cigarettes and e-cigarettes. If you need help quitting, ask your doctor. Keep all follow-up visits as told by your dentist. This is important.  Prevent an abscess Brush your teeth every morning and every night. Use fluoride toothpaste. Floss your teeth each day. Get dental cleanings as often as told by your dentist. Think about getting dental sealant put on teeth that have deep holes (decay). Drink water that has fluoride in it. Most tap water has fluoride. Check the label on bottled water to see if it has fluoride in it. Drink water instead of sugary drinks. Eat healthy meals and snacks. Wear a mouth guard or face shield when you  play sports. Contact a doctor if: Your pain is worse, and medicine does not help. Get help right away if: You have a fever or chills. Your symptoms suddenly get worse. You have a very bad headache. You have problems breathing or swallowing. You have trouble opening your mouth. You have swelling in your neck or close to your eye. Summary A dental abscess is an area of pus in or around a tooth. It is caused by an infection. Treatment will help with symptoms and prevent the infection from  spreading. Take over-the-counter and prescription medicines only as told by your dentist. To prevent an abscess, take good care of your teeth. Brush your teeth every morning and night. Use floss every day. Get dental cleanings as often as told by your dentist. This information is not intended to replace advice given to you by your health care provider. Make sure you discuss any questions you have with your healthcare provider. Document Revised: 12/03/2019 Document Reviewed: 01/14/2020 Elsevier Patient Education  2022 [employer].    If you have been instructed to have an in-person evaluation today at a local Urgent Care facility, please use the link below. It will take you to a list of all of our available [facility], including address, phone number and hours of operation. Please do not delay care.  [facility]  If you or a family member do not have a primary care provider, use the link below to schedule a visit and establish care. When you choose a [facility] primary care physician or advanced practice provider, you gain a long-term partner in health. Find a Primary Care Provider  Learn more about [facility]'s in-office and virtual care options: [facility] - Get Care Now

## 2021-03-07 ENCOUNTER — Ambulatory Visit: Payer: Managed Care, Other (non HMO) | Admitting: Hematology & Oncology

## 2021-03-07 ENCOUNTER — Other Ambulatory Visit: Payer: Managed Care, Other (non HMO)

## 2021-04-09 ENCOUNTER — Encounter: Payer: Self-pay | Admitting: Family Medicine

## 2021-04-09 ENCOUNTER — Other Ambulatory Visit: Payer: Self-pay

## 2021-04-09 ENCOUNTER — Ambulatory Visit: Payer: Managed Care, Other (non HMO) | Admitting: Family Medicine

## 2021-04-09 VITALS — BP 114/77 | HR 89 | Wt 119.1 lb

## 2021-04-09 DIAGNOSIS — N760 Acute vaginitis: Secondary | ICD-10-CM

## 2021-04-09 DIAGNOSIS — B9689 Other specified bacterial agents as the cause of diseases classified elsewhere: Secondary | ICD-10-CM

## 2021-04-09 DIAGNOSIS — N898 Other specified noninflammatory disorders of vagina: Secondary | ICD-10-CM

## 2021-04-09 LAB — POCT URINE PREGNANCY: Preg Test, Ur: NEGATIVE

## 2021-04-09 NOTE — Progress Notes (Signed)
Acute Office Visit  Subjective:    Patient ID: Jasmine Wyatt, female    DOB: 23-Dec-2000, 20 y.o.   MRN: [id number]  Chief Complaint  Patient presents with   Vaginal Discharge   foul odor    Vaginal Discharge The patient's primary symptoms include vaginal discharge.  Patient is in today for vaginal discharge and odor.   Patient states for the past 3 months or so, she has had white discharge towards the end of her periods which usually clears after using boric acid suppository. States periods always start around the 14th of every month. LMP 03/21/21 with discharge starting on the last day of period and persisting. This time she has noticed a fishy smell to her discharge so she wanted to get it checked. She would also like complete STD screen.   She denies any vaginal/pelvic pain, itching, spotting, rashes, ulcerations, fevers, urinary changes, fevers. She is not on any contraceptives. Reports she has not been sexually active in the past 8 months, but was not using condoms at that time.   Reports she is establishing with an OBGYN and getting her first pap next month, but did not want to wait that long for these symptoms.      History reviewed. No pertinent past medical history.  Past Surgical History:  Procedure Laterality Date   NO PAST SURGERIES     ROOT CANAL  10/2015    Family History  Problem Relation Age of Onset   Hypertension Father    Food Allergy Brother        peanut, shellfish, fish   Allergic rhinitis Brother    Cancer Maternal Grandmother        lung   Hypertension Maternal Grandmother    Asthma Cousin    Eczema Cousin    Angioedema Neg Hx    Atopy Neg Hx    Immunodeficiency Neg Hx    Urticaria Neg Hx     Social History   Socioeconomic History   Marital status: Single    Spouse name: Not on file   Number of children: 0   Years of education: Not on file   Highest education level: Not on file  Occupational History   Occupation: student  Tobacco  Use   Smoking status: Never   Smokeless tobacco: Never  Vaping Use   Vaping Use: Never used  Substance and Sexual Activity   Alcohol use: No    Alcohol/week: 0.0 standard drinks   Drug use: Yes   Sexual activity: Yes    Birth control/protection: Condom  Other Topics Concern   Not on file  Social History Narrative   She currently lives with her mother starlet and her brother Forde Radon. Her parents are separated. Her mother is a Social research officer, government and her father is a Actuary. She currently attends [employer] and is in the 10th grade. She runs track in the fall in the spring and plans on being a fashion designer   Social Determinants of Health   Financial Resource Strain: Not on file  Food Insecurity: Not on file  Transportation Needs: Not on file  Physical Activity: Not on file  Stress: Not on file  Social Connections: Not on file  Intimate Partner Violence: Not on file    Outpatient Medications Prior to Visit  Medication Sig Dispense Refill   escitalopram (LEXAPRO) 10 MG tablet Take 1 tablet (10 mg total) by mouth daily. (Patient not taking: Reported on 04/09/2021) 90 tablet 1  No facility-administered medications prior to visit.    No Known Allergies  Review of Systems  Genitourinary:  Positive for vaginal discharge.  All review of systems negative except what is listed in the HPI     Objective:    Physical Exam Vitals reviewed.  Constitutional:      Appearance: Normal appearance.  Genitourinary:    General: Normal vulva.     Vagina: Vaginal discharge present.     Comments: Milky vaginal discharge Skin:    Findings: No erythema or rash.  Neurological:     Mental Status: She is alert and oriented to person, place, and time.  Psychiatric:        Mood and Affect: Mood normal.        Behavior: Behavior normal.        Thought Content: Thought content normal.        Judgment: Judgment normal.    BP 114/77 (BP Location: Left Arm, Patient Position: Sitting,  Cuff Size: Normal)   Pulse 89   Wt 119 lb 1.9 oz (54 kg)   BMI 21.79 kg/m  Wt Readings from Last 3 Encounters:  04/09/21 119 lb 1.9 oz (54 kg)  01/24/21 118 lb (53.5 kg)  12/05/20 118 lb (53.5 kg)    Health Maintenance Due  Topic Date Due   PAP-Cervical Cytology Screening  Never done   PAP SMEAR-Modifier  Never done   CHLAMYDIA SCREENING  03/06/2021    There are no preventive care reminders to display for this patient.   Lab Results  Component Value Date   TSH 1.47 12/05/2020   Lab Results  Component Value Date   WBC 4.6 01/24/2021   HGB 12.7 01/24/2021   HCT 38.4 01/24/2021   MCV 84.8 01/24/2021   PLT 245 01/24/2021   Lab Results  Component Value Date   NA 136 01/24/2021   K 3.5 01/24/2021   CO2 28 01/24/2021   GLUCOSE 89 01/24/2021   BUN 8 01/24/2021   CREATININE 0.86 01/24/2021   BILITOT 0.4 01/24/2021   ALKPHOS 47 01/24/2021   AST 19 01/24/2021   ALT 12 01/24/2021   PROT 7.4 01/24/2021   ALBUMIN 4.1 01/24/2021   CALCIUM 9.7 01/24/2021   ANIONGAP 6 01/24/2021   No results found for: CHOL No results found for: HDL No results found for: LDLCALC No results found for: TRIG No results found for: CHOLHDL No results found for: RFXJ8I     Assessment & Plan:   Problem List Items Addressed This Visit   None Visit Diagnoses     Foul smelling vaginal discharge    -  Primary   Relevant Orders   SureSwab Advanced Vaginitis Plus,TMA   POCT urine pregnancy (Completed)      UPT negative. Sending SureSwab to test for yeast, BV, and STIs. Will update patient with results. Recommended good hygiene and safe sex. Patient aware of signs/symptoms requiring further/urgent evaluation.  Follow-up as needed, if symptoms worsen or fail to improve.    Clayborne Dana, NP

## 2021-04-09 NOTE — Progress Notes (Signed)
Open in error

## 2021-04-11 LAB — SURESWAB® ADVANCED VAGINITIS PLUS,TMA
C. trachomatis RNA, TMA: NOT DETECTED
CANDIDA SPECIES: NOT DETECTED
Candida glabrata: NOT DETECTED
N. gonorrhoeae RNA, TMA: NOT DETECTED
SURESWAB(R) ADV BACTERIAL VAGINOSIS(BV),TMA: POSITIVE — AB
TRICHOMONAS VAGINALIS (TV),TMA: NOT DETECTED

## 2021-04-11 MED ORDER — METRONIDAZOLE 500 MG PO TABS: 500.0000 mg | ORAL_TABLET | Freq: Two times a day (BID) | ORAL | 0 refills | Status: AC

## 2021-04-11 NOTE — Addendum Note (Signed)
Addended by: Hyman Hopes B on: 04/11/2021 03:29 PM   Modules accepted: Orders

## 2021-04-12 ENCOUNTER — Encounter: Payer: Self-pay | Admitting: Family Medicine

## 2021-04-17 ENCOUNTER — Telehealth: Payer: Managed Care, Other (non HMO) | Admitting: Family Medicine

## 2021-04-20 LAB — HM PAP SMEAR

## 2021-04-23 ENCOUNTER — Ambulatory Visit: Payer: Managed Care, Other (non HMO) | Admitting: Family Medicine

## 2021-05-02 ENCOUNTER — Encounter: Payer: Self-pay | Admitting: Nurse Practitioner

## 2021-05-02 ENCOUNTER — Telehealth: Payer: Managed Care, Other (non HMO) | Admitting: Nurse Practitioner

## 2021-05-02 DIAGNOSIS — B9689 Other specified bacterial agents as the cause of diseases classified elsewhere: Secondary | ICD-10-CM | POA: Diagnosis not present

## 2021-05-02 DIAGNOSIS — N760 Acute vaginitis: Secondary | ICD-10-CM

## 2021-05-02 MED ORDER — METRONIDAZOLE 500 MG PO TABS: 500.0000 mg | ORAL_TABLET | Freq: Two times a day (BID) | ORAL | 0 refills | Status: DC

## 2021-05-02 MED ORDER — FLUCONAZOLE 150 MG PO TABS: 150.0000 mg | ORAL_TABLET | Freq: Once | ORAL | 0 refills | Status: AC

## 2021-05-02 NOTE — Progress Notes (Signed)
Virtual Visit Consent   [name], you are scheduled for a virtual visit with Mary-Margaret Daphine Deutscher, FNP, a [facility] provider, today.     Just as with appointments in the office, your consent must be obtained to participate.  Your consent will be active for this visit and any virtual visit you may have with one of our providers in the next 365 days.     If you have a MyChart account, a copy of this consent can be sent to you electronically.  All virtual visits are billed to your insurance company just like a traditional visit in the office.    As this is a virtual visit, video technology does not allow for your provider to perform a traditional examination.  This may limit your provider's ability to fully assess your condition.  If your provider identifies any concerns that need to be evaluated in person or the need to arrange testing (such as labs, EKG, etc.), we will make arrangements to do so.     Although advances in technology are sophisticated, we cannot ensure that it will always work on either your end or our end.  If the connection with a video visit is poor, the visit may have to be switched to a telephone visit.  With either a video or telephone visit, we are not always able to ensure that we have a secure connection.     I need to obtain your verbal consent now.   Are you willing to proceed with your visit today? YES   TAWNIE EHRESMAN has provided verbal consent on 05/02/2021 for a virtual visit (video or telephone).   Mary-Margaret Daphine Deutscher, FNP   Date: 05/02/2021 5:38 PM   Virtual Visit via Video Note   I, Mary-Margaret Daphine Deutscher, connected with SIERRAH LUEVANO ([id number], 08-19-2000) on 05/02/21 at  5:45 PM EDT by a video-enabled telemedicine application and verified that I am speaking with the correct person using two identifiers.  Location: Patient: \ Provider: Virtual Visit Location Provider: Mobile   I discussed the limitations of evaluation and management by  telemedicine and the availability of in person appointments. The patient expressed understanding and agreed to proceed.    History of Present Illness: CINCERE DEPREY is a 20 y.o. who identifies as a female who was assigned female at birth, and is being seen today for vaginal discharge.  HPI: Patient had appointment with GYN on 04/20/21. Was dx with bacterial vaginosis and was given flagyl 500bid for 7 days. Patient said she did not read the prescription bottle and she was only taken meds 1x a day instead of BID. Now symptoms are back.   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 (Patient not taking: Reported on 04/09/2021), Disp: 90 tablet, Rfl: 1  Observations/Objective: Patient is well-developed, well-nourished in no acute distress.  Resting comfortably  at home.  Head is normocephalic, atraumatic.  No labored breathing.  Speech is clear and coherent with logical content.  Patient is alert and oriented at baseline.  Assessment and Plan:  [name] in today with chief complaint of No chief complaint on file.   1. Bacterial vaginosis No bubble baths Will treat with flagyl again and will also send in diflucan to be on safe side. Meds ordered this encounter  Medications   metroNIDAZOLE (FLAGYL) 500 MG tablet    Sig: Take 1 tablet (500 mg total) by mouth 2 (two) times daily.    Dispense:  14 tablet    Refill:  0    Order Specific Question:   Supervising Provider    Answer:    MILLER, BRIAN [[id number]]   fluconazole (DIFLUCAN) 150 MG tablet    Sig: Take 1 tablet (150 mg total) by mouth once for 1 dose.    Dispense:  1 tablet    Refill:  0    Order Specific Question:   Supervising Provider    Answer:   Eber Hong [[id number]]      Follow Up Instructions: I discussed the assessment and treatment plan with the patient. The patient was provided an opportunity to ask questions and all were answered. The patient agreed with the plan and demonstrated an understanding of the instructions.  A copy of instructions were sent to the patient via MyChart.  The patient was advised to call back or seek an in-person evaluation if the symptoms worsen or if the condition fails to improve as anticipated.  Time:  I spent 10 minutes with the patient via telehealth technology discussing the above problems/concerns.    Mary-Margaret Daphine Deutscher, FNP

## 2021-05-14 ENCOUNTER — Telehealth (INDEPENDENT_AMBULATORY_CARE_PROVIDER_SITE_OTHER): Payer: Managed Care, Other (non HMO) | Admitting: Family Medicine

## 2021-05-14 DIAGNOSIS — B9689 Other specified bacterial agents as the cause of diseases classified elsewhere: Secondary | ICD-10-CM

## 2021-05-14 DIAGNOSIS — N760 Acute vaginitis: Secondary | ICD-10-CM

## 2021-05-14 NOTE — Progress Notes (Signed)
Error: she thought she had canceled the appt.  Was able to do a virtual appt

## 2021-05-14 NOTE — Progress Notes (Signed)
Called pt and lvm advising that I was calling to do her prescreening.   Information below was obtained from previous visit that pt did w/another provider.    Pt did a virtual visit on 10/26 for BV. Prior to this visit she was seen by GYN on 10/14 and was DX with BV. According to the pt she informed the the provider on her virtual visit of the following:   Patient said she did not read the prescription bottle and she was only taken meds 1x a day instead of BID. Now symptoms are back.  She was treated again with flagyl and also sent in diflucan to be on the safe side.

## 2021-06-05 ENCOUNTER — Encounter: Payer: Self-pay | Admitting: Family Medicine

## 2021-06-05 ENCOUNTER — Telehealth (INDEPENDENT_AMBULATORY_CARE_PROVIDER_SITE_OTHER): Payer: Managed Care, Other (non HMO) | Admitting: Family Medicine

## 2021-06-05 VITALS — BP 110/60 | HR 92 | Temp 98.3°F

## 2021-06-05 DIAGNOSIS — J029 Acute pharyngitis, unspecified: Secondary | ICD-10-CM

## 2021-06-05 DIAGNOSIS — J069 Acute upper respiratory infection, unspecified: Secondary | ICD-10-CM | POA: Diagnosis not present

## 2021-06-05 LAB — POCT INFLUENZA A/B
Influenza A, POC: NEGATIVE
Influenza B, POC: NEGATIVE

## 2021-06-05 LAB — POCT RAPID STREP A (OFFICE): Rapid Strep A Screen: NEGATIVE

## 2021-06-05 NOTE — Progress Notes (Signed)
Pt reports her sxs started 2 days ago scratchy,sore throat, followed by mucus. She has tried [place] flu and Emerg-C these have not helped her sxs.   Fatigue, body ache, sinus drainage. She hasn't taken a COVID test but will go get one. She has possibly had exposure via work.

## 2021-06-05 NOTE — Progress Notes (Signed)
Virtual Visit via Video Note  I connected with Jasmine Wyatt on 06/05/21 at  2:00 PM EST by a video enabled telemedicine application and verified that I am speaking with the correct person using two identifiers.   I discussed the limitations of evaluation and management by telemedicine and the availability of in person appointments. The patient expressed understanding and agreed to proceed.  Patient location: at home Provider location: in office  Subjective:    CC:   Chief Complaint  Patient presents with   Sore Throat    HPI:  Pt reports her sxs started 2 days ago scratchy,sore throat, followed by mucus. Maybe a fever last night.  No loose stools. No cough.  She has tried [place] flu and Emerg-C these have not helped her sxs.    Fatigue, body ache, sinus drainage. She hasn't taken a COVID test but will go get one. She has possibly had exposure via work.  Past medical history, Surgical history, Family history not pertinant except as noted below, Social history, Allergies, and medications have been entered into the medical record, reviewed, and corrections made.    Objective:    General: Speaking clearly in complete sentences without any shortness of breath.  Alert and oriented x3.  Normal judgment. No apparent acute distress.  Physical Exam Constitutional:      Appearance: She is well-developed.  HENT:     Head: Normocephalic and atraumatic.     Right Ear: External ear normal.     Left Ear: External ear normal.     Nose: Nose normal.     Mouth/Throat:     Mouth: Mucous membranes are moist.     Pharynx: Posterior oropharyngeal erythema present. No oropharyngeal exudate.     Comments:  Drainage cobblestoning in the posterior pharynx.  Eyes:     Conjunctiva/sclera: Conjunctivae normal.     Pupils: Pupils are equal, round, and reactive to light.  Neck:     Thyroid: No thyromegaly.  Cardiovascular:     Rate and Rhythm: Normal rate and regular rhythm.     Heart  sounds: Normal heart sounds.  Pulmonary:     Effort: Pulmonary effort is normal.     Breath sounds: Normal breath sounds. No wheezing.  Musculoskeletal:     Cervical back: Neck supple.  Lymphadenopathy:     Cervical: No cervical adenopathy.  Skin:    General: Skin is warm and dry.  Neurological:     Mental Status: She is alert and oriented to person, place, and time.      Impression and Recommendations:    Problem List Items Addressed This Visit   None Visit Diagnoses     Pharyngitis, unspecified etiology    -  Primary   Relevant Orders   POCT rapid strep A (Completed)   POCT Influenza A/B (Completed)   Viral upper respiratory tract infection           Orders Placed This Encounter  Procedures   POCT rapid strep A   POCT Influenza A/B     No orders of the defined types were placed in this encounter.  Pharyngitis-she went home and did a COVID test after her virtual visit was negative so asked her to come by so that we could test her for strep throat.  Flu test was negative as well as rapid strep.  Discussed that this is a viral illness and recommend continue with symptomatic care.  Work note provided for today and tomorrow.  Call  if not improving or develops new symptoms.  I discussed the assessment and treatment plan with the patient. The patient was provided an opportunity to ask questions and all were answered. The patient agreed with the plan and demonstrated an understanding of the instructions.   The patient was advised to call back or seek an in-person evaluation if the symptoms worsen or if the condition fails to improve as anticipated.   Nani Gasser, MD

## 2021-06-14 ENCOUNTER — Telehealth (INDEPENDENT_AMBULATORY_CARE_PROVIDER_SITE_OTHER): Payer: Self-pay | Admitting: Family Medicine

## 2021-06-14 DIAGNOSIS — Z91199 Patient's noncompliance with other medical treatment and regimen due to unspecified reason: Secondary | ICD-10-CM

## 2021-06-14 NOTE — Progress Notes (Signed)
LVM advising pt that I was calling to do her prescreening. Asked that she sign in so that Dr. Linford Arnold can start her visit if I don't hear from her

## 2021-06-14 NOTE — Progress Notes (Signed)
   Attempted to call pt and LMOM. Sent link for her to connect and waited for 15 min. No connection.

## 2021-06-29 ENCOUNTER — Telehealth: Payer: Self-pay | Admitting: *Deleted

## 2021-06-29 ENCOUNTER — Inpatient Hospital Stay (HOSPITAL_BASED_OUTPATIENT_CLINIC_OR_DEPARTMENT_OTHER): Payer: Managed Care, Other (non HMO) | Admitting: Family

## 2021-06-29 ENCOUNTER — Other Ambulatory Visit: Payer: Self-pay

## 2021-06-29 ENCOUNTER — Inpatient Hospital Stay: Payer: Managed Care, Other (non HMO) | Attending: Hematology & Oncology

## 2021-06-29 ENCOUNTER — Encounter: Payer: Self-pay | Admitting: Family

## 2021-06-29 VITALS — BP 111/65 | HR 73 | Temp 98.6°F | Resp 18 | Ht 62.0 in | Wt 117.1 lb

## 2021-06-29 DIAGNOSIS — R0602 Shortness of breath: Secondary | ICD-10-CM | POA: Insufficient documentation

## 2021-06-29 DIAGNOSIS — D500 Iron deficiency anemia secondary to blood loss (chronic): Secondary | ICD-10-CM

## 2021-06-29 DIAGNOSIS — R5383 Other fatigue: Secondary | ICD-10-CM | POA: Diagnosis not present

## 2021-06-29 DIAGNOSIS — R002 Palpitations: Secondary | ICD-10-CM | POA: Diagnosis not present

## 2021-06-29 DIAGNOSIS — N920 Excessive and frequent menstruation with regular cycle: Secondary | ICD-10-CM | POA: Diagnosis not present

## 2021-06-29 DIAGNOSIS — R42 Dizziness and giddiness: Secondary | ICD-10-CM | POA: Insufficient documentation

## 2021-06-29 DIAGNOSIS — D509 Iron deficiency anemia, unspecified: Secondary | ICD-10-CM

## 2021-06-29 DIAGNOSIS — Z79899 Other long term (current) drug therapy: Secondary | ICD-10-CM | POA: Insufficient documentation

## 2021-06-29 LAB — CBC WITH DIFFERENTIAL (CANCER CENTER ONLY)
Abs Immature Granulocytes: 0.05 10*3/uL (ref 0.00–0.07)
Basophils Absolute: 0 10*3/uL (ref 0.0–0.1)
Basophils Relative: 0 %
Eosinophils Absolute: 0.1 10*3/uL (ref 0.0–0.5)
Eosinophils Relative: 1 %
HCT: 42.1 % (ref 36.0–46.0)
Hemoglobin: 14 g/dL (ref 12.0–15.0)
Immature Granulocytes: 1 %
Lymphocytes Relative: 31 %
Lymphs Abs: 1.7 10*3/uL (ref 0.7–4.0)
MCH: 28.5 pg (ref 26.0–34.0)
MCHC: 33.3 g/dL (ref 30.0–36.0)
MCV: 85.7 fL (ref 80.0–100.0)
Monocytes Absolute: 0.4 10*3/uL (ref 0.1–1.0)
Monocytes Relative: 7 %
Neutro Abs: 3.3 10*3/uL (ref 1.7–7.7)
Neutrophils Relative %: 60 %
Platelet Count: 239 10*3/uL (ref 150–400)
RBC: 4.91 MIL/uL (ref 3.87–5.11)
RDW: 12.1 % (ref 11.5–15.5)
WBC Count: 5.5 10*3/uL (ref 4.0–10.5)
nRBC: 0 % (ref 0.0–0.2)

## 2021-06-29 LAB — IRON AND IRON BINDING CAPACITY (CC-WL,HP ONLY)
Iron: 124 ug/dL (ref 28–170)
Saturation Ratios: 31 % (ref 10.4–31.8)
TIBC: 399 ug/dL (ref 250–450)
UIBC: 275 ug/dL (ref 148–442)

## 2021-06-29 LAB — RETICULOCYTES
Immature Retic Fract: 6 % (ref 2.3–15.9)
RBC.: 4.86 MIL/uL (ref 3.87–5.11)
Retic Count, Absolute: 57.8 10*3/uL (ref 19.0–186.0)
Retic Ct Pct: 1.2 % (ref 0.4–3.1)

## 2021-06-29 NOTE — Progress Notes (Signed)
°  Hematology and Oncology Follow Up Visit  Jasmine Wyatt [id number] 11-Feb-2001 20 y.o. 06/29/2021   Principle Diagnosis:  Iron deficiency anemia   Current Therapy:   IV iron as indicated    Interim History:  Jasmine Wyatt is here today for follow-up. She is doing well but still notes fatigue, SOB with exertion, occasional lightheadedness and palpitations.  She states that her cycle is regular lasting 7-8 days with heavy flow.  No other blood loss noted. No bruising or petechiae.  No fever, chills, n/v, cough, rash, chest pain, abdominal pain or changes in bowel or bladder habits.  No swelling, tenderness, numbness or tingling in her extremities at this time.  No falls or syncope to report.  She is eating well doing her best to stay well hydrated throughout the day. Her weight is stable at 117 lbs.   ECOG Performance Status: 1 - Symptomatic but completely ambulatory  Medications:  Allergies as of 06/29/2021   No Known Allergies      Medication List    as of June 29, 2021 12:25 PM   You have not been prescribed any medications.     Allergies: No Known Allergies  Past Medical History, Surgical history, Social history, and Family History were reviewed and updated.  Review of Systems: All other 10 point review of systems is negative.   Physical Exam:  vitals were not taken for this visit.   Wt Readings from Last 3 Encounters:  04/09/21 119 lb 1.9 oz (54 kg)  01/24/21 118 lb (53.5 kg)  12/05/20 118 lb (53.5 kg)    Ocular: Sclerae unicteric, pupils equal, round and reactive to light Ear-nose-throat: Oropharynx clear, dentition fair Lymphatic: No cervical or supraclavicular adenopathy Lungs no rales or rhonchi, good excursion bilaterally Heart regular rate and rhythm, no murmur appreciated Abd soft, nontender, positive bowel sounds MSK no focal spinal tenderness, no joint edema Neuro: non-focal, well-oriented, appropriate affect Breasts: Deferred   Lab Results   Component Value Date   WBC 4.6 01/24/2021   HGB 12.7 01/24/2021   HCT 38.4 01/24/2021   MCV 84.8 01/24/2021   PLT 245 01/24/2021   Lab Results  Component Value Date   FERRITIN 9 (L) 01/24/2021   IRON 57 01/24/2021   TIBC 407 01/24/2021   UIBC 350 01/24/2021   IRONPCTSAT 14 01/24/2021   Lab Results  Component Value Date   RETICCTPCT 1.0 01/24/2021   RBC 4.53 01/24/2021   RBC 4.51 01/24/2021   No results found for: KPAFRELGTCHN, LAMBDASER, KAPLAMBRATIO No results found for: IGGSERUM, IGA, IGMSERUM No results found for: Georgann Housekeeper, MSPIKE, SPEI   Chemistry      Component Value Date/Time   NA 136 01/24/2021 1500   K 3.5 01/24/2021 1500   CL 102 01/24/2021 1500   CO2 28 01/24/2021 1500   BUN 8 01/24/2021 1500   CREATININE 0.86 01/24/2021 1500   CREATININE 0.79 01/21/2019 1021      Component Value Date/Time   CALCIUM 9.7 01/24/2021 1500   ALKPHOS 47 01/24/2021 1500   AST 19 01/24/2021 1500   ALT 12 01/24/2021 1500   BILITOT 0.4 01/24/2021 1500       Impression and Plan: Jasmine Wyatt is a very pleasant 20 yo African American female with recent iron deficiency. Iron studies are pending.  Follow-up in 3 months.   Eileen Stanford, NP 12/23/202212:25 PM

## 2021-06-29 NOTE — Telephone Encounter (Signed)
Per 06/29/21 los - gave upcoming appointments - confirmed °

## 2021-07-03 LAB — FERRITIN: Ferritin: 24 ng/mL (ref 11–307)

## 2021-08-20 ENCOUNTER — Emergency Department (HOSPITAL_BASED_OUTPATIENT_CLINIC_OR_DEPARTMENT_OTHER)
Admission: EM | Admit: 2021-08-20 | Discharge: 2021-08-21 | Disposition: A | Discharge status: Home / Self Care | Payer: Managed Care, Other (non HMO) | Attending: Emergency Medicine | Admitting: Emergency Medicine

## 2021-08-20 ENCOUNTER — Emergency Department (HOSPITAL_BASED_OUTPATIENT_CLINIC_OR_DEPARTMENT_OTHER): Payer: Managed Care, Other (non HMO)

## 2021-08-20 ENCOUNTER — Encounter (HOSPITAL_BASED_OUTPATIENT_CLINIC_OR_DEPARTMENT_OTHER): Payer: Self-pay

## 2021-08-20 ENCOUNTER — Other Ambulatory Visit: Payer: Self-pay

## 2021-08-20 DIAGNOSIS — W208XXA Other cause of strike by thrown, projected or falling object, initial encounter: Secondary | ICD-10-CM | POA: Insufficient documentation

## 2021-08-20 DIAGNOSIS — S92421A Displaced fracture of distal phalanx of right great toe, initial encounter for closed fracture: Secondary | ICD-10-CM | POA: Insufficient documentation

## 2021-08-20 DIAGNOSIS — Y9239 Other specified sports and athletic area as the place of occurrence of the external cause: Secondary | ICD-10-CM | POA: Insufficient documentation

## 2021-08-20 DIAGNOSIS — S99921A Unspecified injury of right foot, initial encounter: Secondary | ICD-10-CM | POA: Diagnosis present

## 2021-08-20 DIAGNOSIS — T1490XA Injury, unspecified, initial encounter: Secondary | ICD-10-CM

## 2021-08-20 DIAGNOSIS — Y93B3 Activity, free weights: Secondary | ICD-10-CM | POA: Diagnosis not present

## 2021-08-20 MED ORDER — HYDROCODONE-ACETAMINOPHEN 5-325 MG PO TABS
1.0000 | ORAL_TABLET | Freq: Once | ORAL | Status: AC
  Administered 2021-08-20: 1 via ORAL
  Filled 2021-08-20: qty 1

## 2021-08-20 NOTE — ED Provider Notes (Signed)
[facility] EMERGENCY DEPARTMENT Provider Note   CSN: [id number] Arrival date & time: 08/20/21  2230     History  Chief Complaint  Patient presents with   Foot Injury    Jasmine Wyatt is a 21 y.o. female.  With no pertinent past medical history presents to emergency department with foot injury.  Patient states that tonight she was at the gym when she was carrying a 45 pound plate.  She states that she lifted this in front of her and accidentally dropped it onto her right foot.  She states that it landed just proximal to her right toes.  She had immediate pain.  She states that she has been ambulatory since the event however has some pain with walking.  No other injuries.   Foot Injury     Home Medications Prior to Admission medications   Not on File      Allergies    Patient has no known allergies.    Review of Systems   Review of Systems  Musculoskeletal:  Positive for arthralgias, gait problem and myalgias.  All other systems reviewed and are negative.  Physical Exam Updated Vital Signs BP 135/90 (BP Location: Right Arm)    Pulse 99    Temp 98.2 F (36.8 C) (Oral)    Resp 20    Ht 5\' 2"  (1.575 m)    Wt 54 kg    LMP 08/02/2021    SpO2 100%    BMI 21.77 kg/m  Physical Exam Vitals and nursing note reviewed.  Constitutional:      General: She is not in acute distress.    Appearance: Normal appearance.  HENT:     Head: Normocephalic and atraumatic.  Eyes:     General: No scleral icterus.    Extraocular Movements: Extraocular movements intact.  Cardiovascular:     Pulses: Normal pulses.  Pulmonary:     Effort: Pulmonary effort is normal. No respiratory distress.  Musculoskeletal:        General: Swelling, tenderness and signs of injury present. No deformity.     Cervical back: Neck supple.     Right lower leg: No edema.     Left lower leg: No edema.     Right foot: Decreased range of motion. Normal capillary refill. Swelling, tenderness and bony  tenderness present. No laceration. Normal pulse.     Left foot: Normal.     Comments: Tenderness and mild swelling over the right great toe at the PIP.  There is some mild redness to the dorsum of the foot. Decreased dorsiflexion of the right foot r/t pain. DP pulse 2+. Normal cap refill.   Skin:    General: Skin is warm and dry.     Capillary Refill: Capillary refill takes less than 2 seconds.     Findings: Bruising present. No rash.  Neurological:     General: No focal deficit present.     Mental Status: She is alert and oriented to person, place, and time. Mental status is at baseline.     Sensory: No sensory deficit.     Motor: No weakness.  Psychiatric:        Mood and Affect: Mood normal.        Behavior: Behavior normal.        Thought Content: Thought content normal.        Judgment: Judgment normal.    ED Results / Procedures / Treatments   Labs (all labs ordered are listed, but  only abnormal results are displayed) Labs Reviewed - No data to display  EKG None  Radiology DG Foot Complete Right  Result Date: 08/20/2021 CLINICAL DATA:  Dropped heavy object on foot. EXAM: RIGHT FOOT COMPLETE - 3+ VIEW COMPARISON:  None. FINDINGS: Small bone fragments noted adjacent to the IP joint of the right great toe concerning for small avulsed fragments. Recommend correlation for pain in this area. No additional fracture. No subluxation or dislocation. IMPRESSION: Small bone fragments adjacent to the IP joint of the right great toe concerning for avulsed fragments. Recommend correlation for pain in this area. Electronically Signed   By: Charlett Nose M.D.   On: 08/20/2021 23:04    Procedures Procedures   Medications Ordered in ED Medications  HYDROcodone-acetaminophen (NORCO/VICODIN) 5-325 MG per tablet 1 tablet (1 tablet Oral Given 08/20/21 2343)    ED Course/ Medical Decision Making/ A&P                           Medical Decision Making Amount and/or Complexity of Data  Reviewed Radiology: ordered.  Risk Prescription drug management.  Patient presents to the ED with complaints of right foot pain. This involves an extensive number of treatment options, and is a complaint that carries with it a low risk of complications and morbidity.   Additional history obtained:  Additional history obtained from: Significant other at bedside External records from outside source obtained and reviewed including: None required  Imaging Studies ordered:  I ordered imaging studies which included x-ray.  I independently reviewed & interpreted imaging & am in agreement with radiology impression. Imaging shows: Small bone fragments adjacent to the IP joint of the right great toe concerning for small avulsed fragments.  Medications  I ordered medication including Norco for pain Reevaluation of the patient after medication shows that patient improved  ED Course: 21 year old female who presents emergency department after dropping a 45 pound plate on her right foot.  Imaging of the right foot shows avulsion fracture of the right great toe.  No other injuries.  Physical exam as noted above.  Neurovascularly intact.  Compartment soft. Patient was placed in postoperative shoe on the right foot.  She is to wear this until she is able to follow-up with [facility] for follow-up appointment.  Given her 1 dose of Norco here in the emergency department for pain relief.  Discussed that she should use ibuprofen at home every 8 hours as well as RICE.  Discussed return precautions.  She verbalized understanding.  After consideration of the diagnostic results and the patients response to treatment, I feel that the patent would benefit from discharge. The patient has been appropriately medically screened and/or stabilized in the ED. I have low suspicion for any other emergent medical condition which would require further screening, evaluation or treatment in the ED or require inpatient  management. The patient is overall well appearing and non-toxic in appearance. They are hemodynamically stable at time of discharge.   Final Clinical Impression(s) / ED Diagnoses Final diagnoses:  Injury  Closed displaced fracture of distal phalanx of right great toe, initial encounter    Rx / DC Orders ED Discharge Orders     None         Cristopher Peru, PA-C 08/28/21 1126    Tanda Rockers A, DO 08/29/21 1305

## 2021-08-20 NOTE — Discharge Instructions (Addendum)
You were seen in the emergency department today for dropping a weight on your toe. Your big toe on your right foot is fractured. We have placed you in a post-operative shoe. You will wear this until you follow-up with orthopedics. Please call [facility] tomorrow to schedule a follow-up appointment. You may take ibuprofen 800mg  every 8 hours as needed for pain. Please keep your foot elevated when resting. You may use ice for 15-20 minutes at a time. Do not put ice directly on your skin. Please wear your post-operative shoe when you are walking until you are able to follow-up with orthopedics.

## 2021-08-20 NOTE — ED Triage Notes (Addendum)
Pt states she dropped a 45 lb weight on tonight foot at the gym ~2hrs PTA-no break in skin noted-NAD-to triage in w/c

## 2021-08-21 NOTE — ED Notes (Addendum)
Patient discharged to home.  All discharge instructions reviewed.  Patient verbalized understanding via teachback method.  VS WDL.  Respirations even and unlabored.  Wheelchair out of ED.   ?

## 2021-08-22 ENCOUNTER — Telehealth: Payer: Self-pay | Admitting: General Practice

## 2021-08-22 ENCOUNTER — Ambulatory Visit (INDEPENDENT_AMBULATORY_CARE_PROVIDER_SITE_OTHER): Payer: Managed Care, Other (non HMO) | Admitting: Sports Medicine

## 2021-08-22 ENCOUNTER — Other Ambulatory Visit: Payer: Self-pay

## 2021-08-22 ENCOUNTER — Ambulatory Visit (INDEPENDENT_AMBULATORY_CARE_PROVIDER_SITE_OTHER): Payer: Managed Care, Other (non HMO)

## 2021-08-22 DIAGNOSIS — S92401A Displaced unspecified fracture of right great toe, initial encounter for closed fracture: Secondary | ICD-10-CM | POA: Diagnosis not present

## 2021-08-22 NOTE — Assessment & Plan Note (Signed)
This is a pleasant 21 year old female, she dropped a 45 pound weight on her right great toe 2 days ago, immediate pain, swelling, bruising, x-rays showed a comminuted fracture at the IP joint. Today she has good sensation, relatively good motion albeit limited by swelling and pain. Repeating x-rays today, continue postop shoe, she is having significant difficulty walking secondary to pain, continue ibuprofen and adding nonweightbearing with crutches for at least the next 2 weeks, return to see me in 2 weeks, expect 8 weeks for healing.

## 2021-08-22 NOTE — Progress Notes (Signed)
° ° °  Procedures performed today:    None.  Independent interpretation of notes and tests performed by another provider:   X-rays personally reviewed, there is a comminuted fracture at the interphalangeal joint of the right great toe.  Brief History, Exam, Impression, and Recommendations:    Fracture of great toe, right, closed This is a pleasant 21 year old female, she dropped a 45 pound weight on her right great toe 2 days ago, immediate pain, swelling, bruising, x-rays showed a comminuted fracture at the IP joint. Today she has good sensation, relatively good motion albeit limited by swelling and pain. Repeating x-rays today, continue postop shoe, she is having significant difficulty walking secondary to pain, continue ibuprofen and adding nonweightbearing with crutches for at least the next 2 weeks, return to see me in 2 weeks, expect 8 weeks for healing.    ___________________________________________ Ihor Austin. Benjamin Stain, M.D., ABFM., CAQSM. Primary Care and Sports Medicine [facility]  Adjunct Instructor of Family Medicine  [employer]

## 2021-08-22 NOTE — Telephone Encounter (Signed)
Transition Care Management Follow-up Telephone Call Date of discharge and from where: 08/21/21 from [facility] How have you been since you were released from the hospital? In pain; unable to walk. Has an appt with Ortho today (Dr. Darene Lamer) to get crutches. Any questions or concerns? No  Items Reviewed: Did the pt receive and understand the discharge instructions provided? Yes  Medications obtained and verified? No  Other? No  Any new allergies since your discharge? No  Dietary orders reviewed? Yes Do you have support at home? Yes   Home Care and Equipment/Supplies: Were home health services ordered? no  Functional Questionnaire: (I = Independent and D = Dependent) ADLs: D  Bathing/Dressing- I  Meal Prep- I  Eating- I  Maintaining continence- I  Transferring/Ambulation- D  Managing Meds- I  Follow up appointments reviewed:  PCP Hospital f/u appt confirmed? No   Specialist Hospital f/u appt confirmed? Yes  Scheduled to see Dr. Darene Lamer (ortho) on 08/22/21 @ 120. Are transportation arrangements needed? No  If their condition worsens, is the pt aware to call PCP or go to the Emergency Dept.? Yes Was the patient provided with contact information for the PCP's office or ED? Yes Was to pt encouraged to call back with questions or concerns? Yes

## 2021-09-05 ENCOUNTER — Ambulatory Visit: Payer: Managed Care, Other (non HMO) | Admitting: Sports Medicine

## 2021-09-11 ENCOUNTER — Ambulatory Visit (INDEPENDENT_AMBULATORY_CARE_PROVIDER_SITE_OTHER): Payer: Managed Care, Other (non HMO) | Admitting: Family Medicine

## 2021-09-11 ENCOUNTER — Encounter: Payer: Self-pay | Admitting: Family Medicine

## 2021-09-11 ENCOUNTER — Other Ambulatory Visit: Payer: Self-pay

## 2021-09-11 VITALS — BP 118/65 | HR 102 | Resp 16 | Ht 62.0 in | Wt 118.0 lb

## 2021-09-11 DIAGNOSIS — J029 Acute pharyngitis, unspecified: Secondary | ICD-10-CM | POA: Diagnosis not present

## 2021-09-11 LAB — POCT RAPID STREP A (OFFICE): Rapid Strep A Screen: NEGATIVE

## 2021-09-11 MED ORDER — AMOXICILLIN 875 MG PO TABS: 875.0000 mg | ORAL_TABLET | Freq: Two times a day (BID) | ORAL | 0 refills | Status: DC

## 2021-09-11 MED ORDER — PREDNISONE 20 MG PO TABS: 40.0000 mg | ORAL_TABLET | Freq: Every day | ORAL | 0 refills | Status: DC

## 2021-09-11 NOTE — Progress Notes (Signed)
? ? ? ?Acute Office Visit ? ?Subjective:  ? ? Patient ID: Jasmine Wyatt, female    DOB: [birth year], 21 y.o.   MRN: [id number] ? ?Chief Complaint  ?Patient presents with  ? Sore Throat  ?  Sore throat 1 day. Exposed to strep 4 days ago. Did not take Covid test.   ? ? ?HPI ?Patient is in today for ST x1 day.  She says it started last evening when she was actually eating she just noticed it was uncomfortable.  Now today it feels more like a stabbing sharp pain.  She was exposed to someone who had strep throat about 4 days ago.  No fevers or chills or sweats.  She did have a little bit of diarrhea this morning but really feels like that was related to what she had for dinner last night.  No other symptoms.  No cold symptoms. ? ?No past medical history on file. ? ?Past Surgical History:  ?Procedure Laterality Date  ? NO PAST SURGERIES    ? ROOT CANAL  10/2015  ? ? ?Family History  ?Problem Relation Age of Onset  ? Hypertension Father   ? Food Allergy Brother   ?     peanut, shellfish, fish  ? Allergic rhinitis Brother   ? Cancer Maternal Grandmother   ?     lung  ? Hypertension Maternal Grandmother   ? Asthma Cousin   ? Eczema Cousin   ? Angioedema Neg Hx   ? Atopy Neg Hx   ? Immunodeficiency Neg Hx   ? Urticaria Neg Hx   ? ? ?Social History  ? ?Socioeconomic History  ? Marital status: Single  ?  Spouse name: Not on file  ? Number of children: 0  ? Years of education: Not on file  ? Highest education level: Not on file  ?Occupational History  ? Occupation: student  ?Tobacco Use  ? Smoking status: Never  ? Smokeless tobacco: Never  ?Vaping Use  ? Vaping Use: Never used  ?Substance and Sexual Activity  ? Alcohol use: Yes  ?  Comment: occ  ? Drug use: Not Currently  ? Sexual activity: Yes  ?  Birth control/protection: Condom  ?Other Topics Concern  ? Not on file  ?Social History Narrative  ? She currently lives with her mother starlet and her brother Forde Radon. Her parents are separated. Her mother is a Social research officer, government and her father  is a Actuary. She currently attends [employer] and is in the 10th grade. She runs track in the fall in the spring and plans on being a fashion designer  ? ?Social Determinants of Health  ? ?Financial Resource Strain: Not on file  ?Food Insecurity: Not on file  ?Transportation Needs: Not on file  ?Physical Activity: Not on file  ?Stress: Not on file  ?Social Connections: Not on file  ?Intimate Partner Violence: Not on file  ? ? ?No outpatient medications prior to visit.  ? ?No facility-administered medications prior to visit.  ? ? ?No Known Allergies ? ?Review of Systems ? ?   ?Objective:  ?  ?Physical Exam ?Constitutional:   ?   Appearance: She is well-developed.  ?HENT:  ?   Head: Normocephalic and atraumatic.  ?   Right Ear: Tympanic membrane, ear canal and external ear normal.  ?   Left Ear: Tympanic membrane, ear canal and external ear normal.  ?   Nose: Nose normal.  ?   Mouth/Throat:  ?  Mouth: Mucous membranes are moist.  ?   Comments: She actually has little patch of erythema on the right side of her throat.  And some cobblestoning posteriorly. ?Eyes:  ?   Conjunctiva/sclera: Conjunctivae normal.  ?   Pupils: Pupils are equal, round, and reactive to light.  ?Neck:  ?   Thyroid: No thyromegaly.  ?Cardiovascular:  ?   Rate and Rhythm: Normal rate and regular rhythm.  ?   Heart sounds: Normal heart sounds.  ?Pulmonary:  ?   Effort: Pulmonary effort is normal.  ?   Breath sounds: Normal breath sounds. No wheezing.  ?Musculoskeletal:  ?   Cervical back: Neck supple.  ?Lymphadenopathy:  ?   Cervical: No cervical adenopathy.  ?Skin: ?   General: Skin is warm and dry.  ?Neurological:  ?   Mental Status: She is alert and oriented to person, place, and time.  ? ? ?BP 118/65   Pulse (!) 102   Resp 16   Ht 5\' 2"  (1.575 m)   Wt 118 lb (53.5 kg)   LMP 09/03/2021   SpO2 99%   BMI 21.58 kg/m?  ?Wt Readings from Last 3 Encounters:  ?09/11/21 118 lb (53.5 kg)  ?08/20/21 119 lb (54 kg)  ?06/29/21 117  lb 1.3 oz (53.1 kg)  ? ? ?Health Maintenance Due  ?Topic Date Due  ? HIV Screening  Never done  ? Hepatitis C Screening  Never done  ? COVID-19 Vaccine (3 - Booster for Pfizer series) 08/27/2020  ? ? ?There are no preventive care reminders to display for this patient. ? ? ?Lab Results  ?Component Value Date  ? TSH 1.47 12/05/2020  ? ?Lab Results  ?Component Value Date  ? WBC 5.5 06/29/2021  ? HGB 14.0 06/29/2021  ? HCT 42.1 06/29/2021  ? MCV 85.7 06/29/2021  ? PLT 239 06/29/2021  ? ?Lab Results  ?Component Value Date  ? NA 136 01/24/2021  ? K 3.5 01/24/2021  ? CO2 28 01/24/2021  ? GLUCOSE 89 01/24/2021  ? BUN 8 01/24/2021  ? CREATININE 0.86 01/24/2021  ? BILITOT 0.4 01/24/2021  ? ALKPHOS 47 01/24/2021  ? AST 19 01/24/2021  ? ALT 12 01/24/2021  ? PROT 7.4 01/24/2021  ? ALBUMIN 4.1 01/24/2021  ? CALCIUM 9.7 01/24/2021  ? ANIONGAP 6 01/24/2021  ? ?No results found for: CHOL ?No results found for: HDL ?No results found for: LDLCALC ?No results found for: TRIG ?No results found for: CHOLHDL ?No results found for: HGBA1C ? ?   ?Assessment & Plan:  ? ?Problem List Items Addressed This Visit   ?None ?Visit Diagnoses   ? ? Sore throat    -  Primary  ? Relevant Medications  ? predniSONE (DELTASONE) 20 MG tablet  ? amoxicillin (AMOXIL) 875 MG tablet  ? Other Relevant Orders  ? POCT rapid strep A (Completed)  ? ?  ? ?Pharyngitis-we discussed starting with the prednisone for couple days, start water gargles, humidifier, etc.  And if not improving then she can take the amoxicillin.  If she develops any new upper respiratory symptoms more consistent with a cold then I did encourage her to test for COVID as well which had quite a few people saying they had a sore throat for couple days before the cold symptoms started with COVID.  ? ?Meds ordered this encounter  ?Medications  ? predniSONE (DELTASONE) 20 MG tablet  ?  Sig: Take 2 tablets (40 mg total) by mouth daily with breakfast.  ?  Dispense:  10 tablet  ?  Refill:  0  ?  amoxicillin (AMOXIL) 875 MG tablet  ?  Sig: Take 1 tablet (875 mg total) by mouth 2 (two) times daily for 10 days.  ?  Dispense:  20 tablet  ?  Refill:  0  ? ? ? ?Nani Gasser, MD ? ?

## 2021-09-11 NOTE — Telephone Encounter (Signed)
Jasmine Wyatt did return pt's call and scheduled appt for today.  Tiajuana Amass, CMA ?

## 2021-09-12 ENCOUNTER — Ambulatory Visit (INDEPENDENT_AMBULATORY_CARE_PROVIDER_SITE_OTHER): Payer: Managed Care, Other (non HMO) | Admitting: Sports Medicine

## 2021-09-12 DIAGNOSIS — S92401D Displaced unspecified fracture of right great toe, subsequent encounter for fracture with routine healing: Secondary | ICD-10-CM | POA: Diagnosis not present

## 2021-09-12 NOTE — Progress Notes (Signed)
? ? ?  Procedures performed today:   ? ?None. ? ?Independent interpretation of notes and tests performed by another provider:  ? ?None. ? ?Brief History, Exam, Impression, and Recommendations:   ? ?Fracture of great toe, right, closed ?This is a pleasant 21 year old female, she dropped a 45 pound weight on her great toe approximately 3 weeks ago, she is doing a lot better today, still has significant swelling, but pain has improved dramatically. ?X-rays at the time showed a comminuted fracture at the IP joint medial aspect. ?She had no angulation or displacement. ?Today she has a bit of tenderness at the fracture site, but otherwise good motion, good strength, just swelling. ?We can do a telephone visit in about 5 weeks which will take her 8 weeks out from the fracture. ? ? ? ?___________________________________________ ?Ihor Austin. Benjamin Stain, M.D., ABFM., CAQSM. ?Primary Care and Sports Medicine ? [facility] ? ?Adjunct Instructor of Family Medicine  ?[employer] ?

## 2021-09-12 NOTE — Assessment & Plan Note (Signed)
This is a pleasant 21 year old female, she dropped a 45 pound weight on her great toe approximately 3 weeks ago, she is doing a lot better today, still has significant swelling, but pain has improved dramatically. ?X-rays at the time showed a comminuted fracture at the IP joint medial aspect. ?She had no angulation or displacement. ?Today she has a bit of tenderness at the fracture site, but otherwise good motion, good strength, just swelling. ?We can do a telephone visit in about 5 weeks which will take her 8 weeks out from the fracture. ? ?

## 2021-09-28 ENCOUNTER — Ambulatory Visit: Payer: Managed Care, Other (non HMO) | Admitting: Family

## 2021-09-28 ENCOUNTER — Other Ambulatory Visit: Payer: Managed Care, Other (non HMO)

## 2021-10-01 ENCOUNTER — Encounter: Payer: Self-pay | Admitting: Family

## 2021-10-01 ENCOUNTER — Other Ambulatory Visit: Payer: Self-pay

## 2021-10-01 ENCOUNTER — Inpatient Hospital Stay: Payer: Managed Care, Other (non HMO) | Attending: Hematology & Oncology

## 2021-10-01 ENCOUNTER — Inpatient Hospital Stay: Payer: Managed Care, Other (non HMO) | Admitting: Family

## 2021-10-01 ENCOUNTER — Encounter: Payer: Self-pay | Admitting: *Deleted

## 2021-10-01 VITALS — BP 114/80 | HR 100 | Temp 98.2°F | Resp 17 | Ht 62.0 in | Wt 118.0 lb

## 2021-10-01 DIAGNOSIS — R5383 Other fatigue: Secondary | ICD-10-CM | POA: Insufficient documentation

## 2021-10-01 DIAGNOSIS — D509 Iron deficiency anemia, unspecified: Secondary | ICD-10-CM | POA: Insufficient documentation

## 2021-10-01 DIAGNOSIS — R002 Palpitations: Secondary | ICD-10-CM | POA: Insufficient documentation

## 2021-10-01 DIAGNOSIS — D500 Iron deficiency anemia secondary to blood loss (chronic): Secondary | ICD-10-CM | POA: Diagnosis present

## 2021-10-01 DIAGNOSIS — Z79899 Other long term (current) drug therapy: Secondary | ICD-10-CM | POA: Insufficient documentation

## 2021-10-01 LAB — CBC WITH DIFFERENTIAL (CANCER CENTER ONLY)
Abs Immature Granulocytes: 0.01 10*3/uL (ref 0.00–0.07)
Basophils Absolute: 0 10*3/uL (ref 0.0–0.1)
Basophils Relative: 1 %
Eosinophils Absolute: 0.1 10*3/uL (ref 0.0–0.5)
Eosinophils Relative: 2 %
HCT: 40.3 % (ref 36.0–46.0)
Hemoglobin: 13.4 g/dL (ref 12.0–15.0)
Immature Granulocytes: 0 %
Lymphocytes Relative: 38 %
Lymphs Abs: 1.4 10*3/uL (ref 0.7–4.0)
MCH: 28 pg (ref 26.0–34.0)
MCHC: 33.3 g/dL (ref 30.0–36.0)
MCV: 84.1 fL (ref 80.0–100.0)
Monocytes Absolute: 0.2 10*3/uL (ref 0.1–1.0)
Monocytes Relative: 5 %
Neutro Abs: 2.1 10*3/uL (ref 1.7–7.7)
Neutrophils Relative %: 54 %
Platelet Count: 252 10*3/uL (ref 150–400)
RBC: 4.79 MIL/uL (ref 3.87–5.11)
RDW: 12.3 % (ref 11.5–15.5)
WBC Count: 3.8 10*3/uL — ABNORMAL LOW (ref 4.0–10.5)
nRBC: 0 % (ref 0.0–0.2)

## 2021-10-01 LAB — IRON AND IRON BINDING CAPACITY (CC-WL,HP ONLY)
Iron: 113 ug/dL (ref 28–170)
Saturation Ratios: 28 % (ref 10.4–31.8)
TIBC: 409 ug/dL (ref 250–450)
UIBC: 296 ug/dL (ref 148–442)

## 2021-10-01 LAB — RETICULOCYTES
Immature Retic Fract: 6.5 % (ref 2.3–15.9)
RBC.: 4.76 MIL/uL (ref 3.87–5.11)
Retic Count, Absolute: 49 10*3/uL (ref 19.0–186.0)
Retic Ct Pct: 1 % (ref 0.4–3.1)

## 2021-10-01 LAB — FERRITIN: Ferritin: 23 ng/mL (ref 11–307)

## 2021-10-01 NOTE — Progress Notes (Signed)
?Hematology and Oncology Follow Up Visit ? ?Jasmine Wyatt ?[id number] ?Feb 10, 2001 21 y.o. ?10/01/2021 ? ? ?Principle Diagnosis:  ?Iron deficiency anemia  ?  ?Current Therapy:        ?IV iron as indicated  ?  ?Interim History:  Jasmine Wyatt is here today for follow-up. She is symptomatic with increased fatigue.  ?She states that her cycle in February was heavier than usual.  ?No other blood loss noted. No bruising or petechiae.  ?She has occasional palpitations with over exertion or anxiety.  ?No fever, chills, n/v, cough, rash, dizziness, SOB, chest pain, abdominal pain or changes in bowel or bladder habits.  ?No swelling, tenderness, numbness or tingling in her extremities.  ?No falls or syncope.  ?She has maintained a good appetite and is staying well hydrated. Her weight is stable at 118 lbs.  ? ?ECOG Performance Status: 1 - Symptomatic but completely ambulatory ? ?Medications:  ?Allergies as of 10/01/2021   ?No Known Allergies ?  ? ?  ?Medication List  ?  ? ?  ? Accurate as of October 01, 2021 10:16 AM. If you have any questions, ask your nurse or doctor.  ?  ?  ? ?  ? ?predniSONE 20 MG tablet ?Commonly known as: DELTASONE ?Take 2 tablets (40 mg total) by mouth daily with breakfast. ?  ? ?  ? ? ?Allergies: No Known Allergies ? ?Past Medical History, Surgical history, Social history, and Family History were reviewed and updated. ? ?Review of Systems: ?All other 10 point review of systems is negative.  ? ?Physical Exam: ? vitals were not taken for this visit.  ? ?Wt Readings from Last 3 Encounters:  ?09/11/21 118 lb (53.5 kg)  ?08/20/21 119 lb (54 kg)  ?06/29/21 117 lb 1.3 oz (53.1 kg)  ? ? ?Ocular: Sclerae unicteric, pupils equal, round and reactive to light ?Ear-nose-throat: Oropharynx clear, dentition fair ?Lymphatic: No cervical or supraclavicular adenopathy ?Lungs no rales or rhonchi, good excursion bilaterally ?Heart regular rate and rhythm, no murmur appreciated ?Abd soft, nontender, positive bowel sounds ?MSK no  focal spinal tenderness, no joint edema ?Neuro: non-focal, well-oriented, appropriate affect ?Breasts: Deferred  ? ?Lab Results  ?Component Value Date  ? WBC 3.8 (L) 10/01/2021  ? HGB 13.4 10/01/2021  ? HCT 40.3 10/01/2021  ? MCV 84.1 10/01/2021  ? PLT 252 10/01/2021  ? ?Lab Results  ?Component Value Date  ? FERRITIN 24 06/29/2021  ? IRON 124 06/29/2021  ? TIBC 399 06/29/2021  ? UIBC 275 06/29/2021  ? IRONPCTSAT 31 06/29/2021  ? ?Lab Results  ?Component Value Date  ? RETICCTPCT 1.2 06/29/2021  ? RBC 4.79 10/01/2021  ? ?No results found for: KPAFRELGTCHN, LAMBDASER, KAPLAMBRATIO ?No results found for: IGGSERUM, IGA, IGMSERUM ?No results found for: TOTALPROTELP, ALBUMINELP, A1GS, A2GS, BETS, BETA2SER, GAMS, MSPIKE, SPEI ?  Chemistry   ?   ?Component Value Date/Time  ? NA 136 01/24/2021 1500  ? K 3.5 01/24/2021 1500  ? CL 102 01/24/2021 1500  ? CO2 28 01/24/2021 1500  ? BUN 8 01/24/2021 1500  ? CREATININE 0.86 01/24/2021 1500  ? CREATININE 0.79 01/21/2019 1021  ?    ?Component Value Date/Time  ? CALCIUM 9.7 01/24/2021 1500  ? ALKPHOS 47 01/24/2021 1500  ? AST 19 01/24/2021 1500  ? ALT 12 01/24/2021 1500  ? BILITOT 0.4 01/24/2021 1500  ?  ? ? ? ?Impression and Plan: Jasmine Wyatt is a very pleasant 21 yo African American female with recent iron deficiency. ?Iron studies pending.  ?  Follow-up in 6 months.  ? ?Jasmine Dawson, NP ?3/27/202310:16 AM  ?

## 2021-10-15 ENCOUNTER — Telehealth (INDEPENDENT_AMBULATORY_CARE_PROVIDER_SITE_OTHER): Payer: Managed Care, Other (non HMO) | Admitting: Sports Medicine

## 2021-10-15 DIAGNOSIS — S92401D Displaced unspecified fracture of right great toe, subsequent encounter for fracture with routine healing: Secondary | ICD-10-CM | POA: Diagnosis not present

## 2021-10-15 NOTE — Assessment & Plan Note (Signed)
21 year old female, dropped a 40 pound pound weight on her great toe, right side approximately 11 weeks ago, doing well.  She was on the phone with another family member, her mother answered, and tells that she is doing great, she can follow-up with me as needed. ?

## 2021-10-15 NOTE — Progress Notes (Signed)
? ?  Virtual Visit via Telephone ?  ?I connected with  Jasmine Wyatt  on 10/15/21 by telephone/telehealth and verified that I am speaking with the correct person using two identifiers, we also communicated with the patient's mother. ?  ?I discussed the limitations, risks, security and privacy concerns of performing an evaluation and management service by telephone, including the higher likelihood of inaccurate diagnosis and treatment, and the availability of in person appointments.  We also discussed the likely need of an additional face to face encounter for complete and high quality delivery of care.  I also discussed with the patient that there may be a patient responsible charge related to this service. The patient expressed understanding and wishes to proceed. ? ?Provider location is in medical facility. ?Patient location is at their home, different from provider location. ?People involved in care of the patient during this telehealth encounter were myself, my nurse/medical assistant, and my front office/scheduling team member. ? ?Review of Systems: No fevers, chills, night sweats, weight loss, chest pain, or shortness of breath.  ? ?Objective Findings:   ? ?General: Speaking full sentences, no audible heavy breathing.  Sounds alert and appropriately interactive.   ? ?Independent interpretation of tests performed by another provider:  ? ?None. ? ?Brief History, Exam, Impression, and Recommendations:   ? ?Fracture of great toe, right, closed ?21 year old female, dropped a 40 pound pound weight on her great toe, right side approximately 11 weeks ago, doing well.  She was on the phone with another family member, her mother answered, and tells that she is doing great, she can follow-up with me as needed. ? ? ?I discussed the above assessment and treatment plan with the patient. The patient was provided an opportunity to ask questions and all were answered. The patient agreed with the plan and demonstrated an  understanding of the instructions. ?  ?The patient was advised to call back or seek an in-person evaluation if the symptoms worsen or if the condition fails to improve as anticipated. ?  ?I provided 10 minutes of verbal and non-verbal time during this encounter date, time was needed to gather information, review chart, records, communicate/coordinate with staff remotely, as well as complete documentation. ? ? ?___________________________________________ ?Gwen Her. Dianah Field, M.D., ABFM., CAQSM. ?Primary Care and Sports Medicine ?[place] ? ?Adjunct Professor of Family Medicine  ?[employer] ?

## 2021-11-01 ENCOUNTER — Telehealth: Payer: Managed Care, Other (non HMO) | Admitting: Family Medicine

## 2022-01-16 ENCOUNTER — Ambulatory Visit: Payer: Managed Care, Other (non HMO) | Admitting: Family Medicine

## 2022-01-16 NOTE — Progress Notes (Deleted)
   Established Patient Office Visit  Subjective   Patient ID: Jasmine Wyatt, female    DOB: [birth year]  Age: 21 y.o. MRN: [id number]  No chief complaint on file.   HPI  {History (Optional):23778}  ROS    Objective:     There were no vitals taken for this visit. {Vitals History (Optional):23777}  Physical Exam   No results found for any visits on 01/16/22.  {Labs (Optional):23779}  The ASCVD Risk score (Arnett DK, et al., 2019) failed to calculate for the following reasons:   The 2019 ASCVD risk score is only valid for ages 45 to 56    Assessment & Plan:   Problem List Items Addressed This Visit       Other   Encounter for counseling regarding contraception - Primary    No follow-ups on file.    Nani Gasser, MD

## 2022-02-20 ENCOUNTER — Telehealth (INDEPENDENT_AMBULATORY_CARE_PROVIDER_SITE_OTHER): Payer: Managed Care, Other (non HMO) | Admitting: Family Medicine

## 2022-02-20 VITALS — Ht 63.0 in | Wt 118.0 lb

## 2022-02-20 DIAGNOSIS — Z3009 Encounter for other general counseling and advice on contraception: Secondary | ICD-10-CM | POA: Diagnosis not present

## 2022-02-20 DIAGNOSIS — F411 Generalized anxiety disorder: Secondary | ICD-10-CM | POA: Insufficient documentation

## 2022-02-20 MED ORDER — NORGESTIM-ETH ESTRAD TRIPHASIC 0.18/0.215/0.25 MG-25 MCG PO TABS: 1.0000 | ORAL_TABLET | Freq: Every day | ORAL | 3 refills | Status: DC

## 2022-02-20 MED ORDER — ESCITALOPRAM OXALATE 10 MG PO TABS: ORAL_TABLET | ORAL | 0 refills | Status: DC

## 2022-02-20 NOTE — Assessment & Plan Note (Signed)
We will go ahead and restart oral contraceptive.  We discussed that depending on how she is feeling on the medication we have lots of choices and we can consider alternatives if needed.  We also discussed other forms of birth control if she is interested.  For now she would like to start with a pill.

## 2022-02-20 NOTE — Assessment & Plan Note (Signed)
Discussed options.  It sounds like she was having some good symptom control when she was taking Lexapro but noticed a big difference when she came off.  So we discussed restarting Lexapro.  Again she does not member having any problems or side effects and she was on a fairly low dose at that time.  We discussed working up to 10 mg. F/u in 6 weeks  GAD 7 score of 15 today.

## 2022-02-20 NOTE — Progress Notes (Signed)
Pt would like to discuss birth control options

## 2022-02-20 NOTE — Progress Notes (Signed)
Virtual Visit via Video Note  I connected with Jasmine Wyatt on 02/20/22 at  8:30 AM EDT by a video enabled telemedicine application and verified that I am speaking with the correct person using two identifiers.   I discussed the limitations of evaluation and management by telemedicine and the availability of in person appointments. The patient expressed understanding and agreed to proceed.  Patient location: at home Provider location: in office  Subjective:    CC:   Chief Complaint  Patient presents with   Follow-up    HPI: interested in discussing birth control options.  She would like to regulate her periods.  She was previously on Tri-Sprintec and did well with that she does not member having any negative problems or side effects.  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  She says she has noticed that since coming off her Lexapro a couple years ago that her anxiety levels have really increased over time she feels like lately its been a little bit more exacerbated.  She wonders if she might have generalized anxiety.  Past medical history, Surgical history, Family history not pertinant except as noted below, Social history, Allergies, and medications have been entered into the medical record, reviewed, and corrections made.    Objective:    General: Speaking clearly in complete sentences without any shortness of breath.  Alert and oriented x3.  Normal judgment. No apparent acute distress.    Impression and Recommendations:    Problem List Items Addressed This Visit       Other   GAD (generalized anxiety disorder) - Primary    Discussed options.  It sounds like she was having some good symptom control when she was taking Lexapro but noticed a big difference when she came off.  So we discussed restarting Lexapro.  Again she does not member having any problems or side effects and she was on a fairly low dose at  that time.  We discussed working up to 10 mg. F/u in 6 weeks  GAD 7 score of 15 today.       Relevant Medications   escitalopram (LEXAPRO) 10 MG tablet   Encounter for counseling regarding contraception    We will go ahead and restart oral contraceptive.  We discussed that depending on how she is feeling on the medication we have lots of choices and we can consider alternatives if needed.  We also discussed other forms of birth control if she is interested.  For now she would like to start with a pill.      Relevant Medications   Norgestimate-Ethinyl Estradiol Triphasic 0.18/0.215/0.25 MG-25 MCG tab    No orders of the defined types were placed in this encounter.   Meds ordered this encounter  Medications   Norgestimate-Ethinyl Estradiol Triphasic 0.18/0.215/0.25 MG-25 MCG tab    Sig: Take 1 tablet by mouth daily.    Dispense:  84 tablet    Refill:  3   escitalopram (LEXAPRO) 10 MG tablet    Sig: Take 0.5 tablets (5 mg total) by mouth daily for 14 days, THEN 1 tablet (10 mg total) daily for 16 days.    Dispense:  90 tablet    Refill:  0   I spent 20 minutes on the day of the encounter to include pre-visit record review, face-to-face time with the patient and post visit ordering of test.   I discussed the assessment and  treatment plan with the patient. The patient was provided an opportunity to ask questions and all were answered. The patient agreed with the plan and demonstrated an understanding of the instructions.   The patient was advised to call back or seek an in-person evaluation if the symptoms worsen or if the condition fails to improve as anticipated.   Jasmine Lecher, MD

## 2022-04-03 ENCOUNTER — Inpatient Hospital Stay: Payer: Managed Care, Other (non HMO) | Admitting: Family

## 2022-04-03 ENCOUNTER — Inpatient Hospital Stay: Payer: Managed Care, Other (non HMO) | Attending: Hematology & Oncology

## 2022-04-10 ENCOUNTER — Ambulatory Visit (INDEPENDENT_AMBULATORY_CARE_PROVIDER_SITE_OTHER): Payer: Managed Care, Other (non HMO) | Admitting: Family Medicine

## 2022-04-10 ENCOUNTER — Encounter: Payer: Self-pay | Admitting: Family Medicine

## 2022-04-10 ENCOUNTER — Other Ambulatory Visit: Payer: Self-pay | Admitting: Family Medicine

## 2022-04-10 ENCOUNTER — Encounter: Payer: Self-pay | Admitting: Family

## 2022-04-10 VITALS — BP 120/87 | HR 83 | Ht 63.0 in | Wt 118.0 lb

## 2022-04-10 DIAGNOSIS — N898 Other specified noninflammatory disorders of vagina: Secondary | ICD-10-CM | POA: Insufficient documentation

## 2022-04-10 DIAGNOSIS — Z202 Contact with and (suspected) exposure to infections with a predominantly sexual mode of transmission: Secondary | ICD-10-CM | POA: Diagnosis not present

## 2022-04-10 LAB — UNLABELED

## 2022-04-10 NOTE — Progress Notes (Signed)
   Acute Office Visit  Subjective:     Patient ID: Jasmine Wyatt, female    DOB: 2001-03-10, 21 y.o.   MRN: [id number]  Chief Complaint  Patient presents with   Vaginal Discharge    HPI Patient is in today for concerns of vaginal discharge. She is also noting a fishy type odor in her discharge. Discharge started three days ago after intercourse where she did use a condom.   Review of Systems  Constitutional:  Negative for chills and fever.  Respiratory:  Negative for cough and shortness of breath.   Cardiovascular:  Negative for chest pain.  Genitourinary:        Vaginal discharge  Neurological:  Negative for headaches.        Objective:    BP 120/87   Pulse 83   Ht 5\' 3"  (1.6 m)   Wt 118 lb (53.5 kg)   SpO2 100%   BMI 20.90 kg/m    Physical Exam Vitals and nursing note reviewed.  Constitutional:      General: She is not in acute distress.    Appearance: Normal appearance.  HENT:     Head: Normocephalic and atraumatic.     Right Ear: External ear normal.     Left Ear: External ear normal.     Nose: Nose normal.  Eyes:     Conjunctiva/sclera: Conjunctivae normal.  Cardiovascular:     Rate and Rhythm: Normal rate.  Pulmonary:     Effort: Pulmonary effort is normal.  Genitourinary:    Comments: Chaperone present and speculum exam done. Copious amount of thin, milk like vaginal discharge. No adnexal masses in vaginal vault Neurological:     General: No focal deficit present.     Mental Status: She is alert and oriented to person, place, and time.  Psychiatric:        Mood and Affect: Mood normal.        Behavior: Behavior normal.        Thought Content: Thought content normal.        Judgment: Judgment normal.     No results found for any visits on 04/10/22.      Assessment & Plan:   Problem List Items Addressed This Visit       Other   Vaginal discharge - Primary    - copious discharge noted in vaginal canal - have ordered wet prep         Relevant Orders   WET PREP FOR [place], YEAST, CLUE   Urine cytology ancillary only   Possible exposure to STD    - pt has concerns of STD exposure so will go ahead and order Urine GC and trich  - have also ordered RPR and HIV testing       Relevant Orders   HIV antibody (with reflex)   RPR    No orders of the defined types were placed in this encounter.   Return if symptoms worsen or fail to improve.  Owens Loffler, DO

## 2022-04-10 NOTE — Assessment & Plan Note (Signed)
-   copious discharge noted in vaginal canal - have ordered wet prep

## 2022-04-10 NOTE — Assessment & Plan Note (Signed)
-   pt has concerns of STD exposure so will go ahead and order Urine GC and trich  - have also ordered RPR and HIV testing

## 2022-04-11 ENCOUNTER — Encounter: Payer: Self-pay | Admitting: Family Medicine

## 2022-04-11 LAB — HIV ANTIBODY (ROUTINE TESTING W REFLEX): HIV 1&2 Ab, 4th Generation: NONREACTIVE

## 2022-04-11 LAB — RPR: RPR Ser Ql: NONREACTIVE

## 2022-04-12 LAB — C. TRACHOMATIS/N. GONORRHOEAE RNA
C. trachomatis RNA, TMA: NOT DETECTED
N. gonorrhoeae RNA, TMA: NOT DETECTED

## 2022-04-12 LAB — TRICHOMONAS VAGINALIS, PROBE AMP: Trichomonas vaginalis RNA: NOT DETECTED

## 2022-04-12 LAB — CLIENT EDUCATION TRACKING

## 2022-04-16 ENCOUNTER — Encounter: Payer: Self-pay | Admitting: Family Medicine

## 2022-04-17 ENCOUNTER — Other Ambulatory Visit: Payer: Self-pay | Admitting: Family Medicine

## 2022-04-17 ENCOUNTER — Telehealth: Payer: Self-pay

## 2022-04-17 ENCOUNTER — Encounter: Payer: Self-pay | Admitting: Family Medicine

## 2022-04-17 DIAGNOSIS — N898 Other specified noninflammatory disorders of vagina: Secondary | ICD-10-CM

## 2022-04-17 MED ORDER — FLUCONAZOLE 150 MG PO TABS: 150.0000 mg | ORAL_TABLET | Freq: Once | ORAL | 0 refills | Status: AC

## 2022-04-17 MED ORDER — METRONIDAZOLE 500 MG PO TABS: 500.0000 mg | ORAL_TABLET | Freq: Two times a day (BID) | ORAL | 0 refills | Status: AC

## 2022-04-29 NOTE — Telephone Encounter (Signed)
error 

## 2022-06-13 ENCOUNTER — Ambulatory Visit: Payer: Managed Care, Other (non HMO) | Admitting: Family Medicine

## 2022-07-03 ENCOUNTER — Other Ambulatory Visit: Payer: Self-pay

## 2022-07-03 ENCOUNTER — Telehealth: Payer: Self-pay

## 2022-07-03 ENCOUNTER — Ambulatory Visit: Payer: Managed Care, Other (non HMO) | Admitting: Family Medicine

## 2022-07-03 ENCOUNTER — Encounter (HOSPITAL_BASED_OUTPATIENT_CLINIC_OR_DEPARTMENT_OTHER): Payer: Self-pay | Admitting: Emergency Medicine

## 2022-07-03 ENCOUNTER — Emergency Department (HOSPITAL_BASED_OUTPATIENT_CLINIC_OR_DEPARTMENT_OTHER)
Admission: EM | Admit: 2022-07-03 | Discharge: 2022-07-03 | Disposition: A | Discharge status: Home / Self Care | Payer: BC Managed Care – PPO | Attending: Emergency Medicine | Admitting: Emergency Medicine

## 2022-07-03 DIAGNOSIS — O210 Mild hyperemesis gravidarum: Secondary | ICD-10-CM | POA: Insufficient documentation

## 2022-07-03 DIAGNOSIS — Z3A09 9 weeks gestation of pregnancy: Secondary | ICD-10-CM | POA: Diagnosis not present

## 2022-07-03 LAB — URINALYSIS, ROUTINE W REFLEX MICROSCOPIC
Bilirubin Urine: NEGATIVE
Glucose, UA: NEGATIVE mg/dL
Hgb urine dipstick: NEGATIVE
Ketones, ur: NEGATIVE mg/dL
Leukocytes,Ua: NEGATIVE
Nitrite: NEGATIVE
Protein, ur: NEGATIVE mg/dL
Specific Gravity, Urine: 1.025 (ref 1.005–1.030)
pH: 7 (ref 5.0–8.0)

## 2022-07-03 LAB — LIPASE, BLOOD: Lipase: 32 U/L (ref 11–51)

## 2022-07-03 LAB — CBC
HCT: 41.4 % (ref 36.0–46.0)
Hemoglobin: 14.1 g/dL (ref 12.0–15.0)
MCH: 28.4 pg (ref 26.0–34.0)
MCHC: 34.1 g/dL (ref 30.0–36.0)
MCV: 83.5 fL (ref 80.0–100.0)
Platelets: 295 10*3/uL (ref 150–400)
RBC: 4.96 MIL/uL (ref 3.87–5.11)
RDW: 12.5 % (ref 11.5–15.5)
WBC: 7.3 10*3/uL (ref 4.0–10.5)
nRBC: 0 % (ref 0.0–0.2)

## 2022-07-03 LAB — COMPREHENSIVE METABOLIC PANEL
ALT: 24 U/L (ref 0–44)
AST: 19 U/L (ref 15–41)
Albumin: 3.9 g/dL (ref 3.5–5.0)
Alkaline Phosphatase: 47 U/L (ref 38–126)
Anion gap: 8 (ref 5–15)
BUN: 7 mg/dL (ref 6–20)
CO2: 24 mmol/L (ref 22–32)
Calcium: 9.5 mg/dL (ref 8.9–10.3)
Chloride: 106 mmol/L (ref 98–111)
Creatinine, Ser: 0.64 mg/dL (ref 0.44–1.00)
GFR, Estimated: >60 mL/min (ref >60)
Glucose, Bld: 92 mg/dL (ref 70–99)
Potassium: 3.7 mmol/L (ref 3.5–5.1)
Sodium: 138 mmol/L (ref 135–145)
Total Bilirubin: 0.4 mg/dL (ref 0.3–1.2)
Total Protein: 8.2 g/dL — ABNORMAL HIGH (ref 6.5–8.1)

## 2022-07-03 LAB — PREGNANCY, URINE: Preg Test, Ur: POSITIVE — AB

## 2022-07-03 LAB — HCG, QUANTITATIVE, PREGNANCY: hCG, Beta Chain, Quant, S: 206783 m[IU]/mL — ABNORMAL HIGH (ref <5)

## 2022-07-03 MED ORDER — METOCLOPRAMIDE HCL 10 MG PO TABS: 10.0000 mg | ORAL_TABLET | Freq: Three times a day (TID) | ORAL | 0 refills | Status: DC | PRN

## 2022-07-03 MED ORDER — SODIUM CHLORIDE 0.9 % IV BOLUS
1000.0000 mL | Freq: Once | INTRAVENOUS | Status: AC
  Administered 2022-07-03: 1000 mL via INTRAVENOUS

## 2022-07-03 MED ORDER — ONDANSETRON 4 MG PO TBDP
4.0000 mg | ORAL_TABLET | Freq: Once | ORAL | Status: AC | PRN
  Administered 2022-07-03: 4 mg via ORAL
  Filled 2022-07-03: qty 1

## 2022-07-03 NOTE — Telephone Encounter (Signed)
Patient was on schedule with karen dolby, NP-  she was showing as reason for visit as vomiting blood and black stool.  Per Dr. Clementeen Hoof suggested we call patient to see if patient should go to ER instead. Called patient and she states she does have bright red blood pieces in her vomit and her stool is black  and then mentioned that she is also [redacted] weeks pregnant. Patient was advised to go to ER for treatment  and was taken off of our schedule. Patient agreed to recommendations.

## 2022-07-03 NOTE — ED Triage Notes (Signed)
Patient presents to ED via POV from home. Here with morning sickness x 2 weeks. OBGYN appointment scheduled for January. Unable to keep food/fluids down.

## 2022-07-03 NOTE — ED Provider Notes (Signed)
[facility] EMERGENCY DEPARTMENT Provider Note   CSN: [id number] Arrival date & time: 07/03/22  6144     History  Chief Complaint  Patient presents with   Morning Sickness    Jasmine Wyatt is a 21 y.o. female.  HPI Patient is about [redacted] weeks pregnant.  Now nausea vomiting for the last 2 weeks.  Initially denied having seen OB/GYN but appears has been seen for potential abortion planning.  Had intrauterine pregnancy confirmed and was Rh+.  Still feeling bad.  Has had crampy upper and lower abdominal pain.  His had more consistent vomiting with some blood in the emesis and some dark stool.  States since being in the waiting room getting some nausea medicine she is feeling better.   No past medical history on file.  Home Medications Prior to Admission medications   Medication Sig Start Date End Date Taking? Authorizing Provider  metoCLOPramide (REGLAN) 10 MG tablet Take 1 tablet (10 mg total) by mouth every 8 (eight) hours as needed for nausea. 07/03/22  Yes Benjiman Core, MD  escitalopram (LEXAPRO) 10 MG tablet Take 0.5 tablets (5 mg total) by mouth daily for 14 days, THEN 1 tablet (10 mg total) daily for 16 days. 02/20/22 03/22/22  Agapito Games, MD  Norgestimate-Ethinyl Estradiol Triphasic 0.18/0.215/0.25 MG-25 MCG tab Take 1 tablet by mouth daily. 02/20/22   Agapito Games, MD      Allergies    Patient has no known allergies.    Review of Systems   Review of Systems  Physical Exam Updated Vital Signs BP 110/65   Pulse 84   Temp 98.8 F (37.1 C) (Oral)   Resp 18   LMP 04/26/2022   SpO2 100%  Physical Exam Vitals and nursing note reviewed.  Eyes:     Pupils: Pupils are equal, round, and reactive to light.  Cardiovascular:     Rate and Rhythm: Regular rhythm.  Pulmonary:     Breath sounds: No wheezing.  Abdominal:     Tenderness: There is no abdominal tenderness.  Musculoskeletal:        General: No tenderness.     Cervical back: Neck  supple.  Skin:    General: Skin is warm.     Capillary Refill: Capillary refill takes less than 2 seconds.  Neurological:     Mental Status: She is alert and oriented to person, place, and time.     ED Results / Procedures / Treatments   Labs (all labs ordered are listed, but only abnormal results are displayed) Labs Reviewed  COMPREHENSIVE METABOLIC PANEL - Abnormal; Notable for the following components:      Result Value   Total Protein 8.2 (*)    All other components within normal limits  PREGNANCY, URINE - Abnormal; Notable for the following components:   Preg Test, Ur POSITIVE (*)    All other components within normal limits  HCG, QUANTITATIVE, PREGNANCY - Abnormal; Notable for the following components:   hCG, Beta Chain, [place] Longest 315,400 (*)    All other components within normal limits  LIPASE, BLOOD  CBC  URINALYSIS, ROUTINE W REFLEX MICROSCOPIC    EKG None  Radiology No results found.  Procedures Procedures    Medications Ordered in ED Medications  ondansetron (ZOFRAN-ODT) disintegrating tablet 4 mg (4 mg Oral Given 07/03/22 1010)  sodium chloride 0.9 % bolus 1,000 mL (0 mLs Intravenous Stopped 07/03/22 1300)    ED Course/ Medical Decision Making/ A&P  Medical Decision Making Amount and/or Complexity of Data Reviewed Labs: ordered.  Risk Prescription drug management.  Patient with nausea vomiting.  Pregnant.  Reportedly [redacted] weeks pregnant and I reviewed notes and was able to find confirmed intrauterine pregnancy.  Lab work reassuring.  Feeling somewhat better.  Will give fluid bolus.  Likely will require discharge home with antiemetics.  Urine does not show infection or even severe dehydration.-  Patient feels better after treatment.  Has tolerated some oral.  Eager to go home.  Will treat symptomatically.  Doubt ectopic pregnancy.  Discharge home.       Final Clinical Impression(s) / ED Diagnoses Final diagnoses:   Morning sickness    Rx / DC Orders ED Discharge Orders          Ordered    metoCLOPramide (REGLAN) 10 MG tablet  Every 8 hours PRN        07/03/22 1330              Benjiman Core, MD 07/03/22 1336

## 2022-07-04 ENCOUNTER — Telehealth: Payer: Self-pay | Admitting: General Practice

## 2022-07-04 NOTE — Telephone Encounter (Signed)
Transition Care Management Follow-up Telephone Call Date of discharge and from where: 07/03/22 from [facility] How have you been since you were released from the hospital? Patient stated she is doing fine at this time. She did not want to schedule follow up at this time.  Any questions or concerns? No  Items Reviewed: Did the pt receive and understand the discharge instructions provided? Yes  Medications obtained and verified? No  Other? No  Any new allergies since your discharge? No  Dietary orders reviewed? No Do you have support at home? Yes   Home Care and Equipment/Supplies: Were home health services ordered? no   Functional Questionnaire: (I = Independent and D = Dependent) ADLs: I  Bathing/Dressing- I  Meal Prep- I  Eating- I  Maintaining continence- I  Transferring/Ambulation- I  Managing Meds- I  Follow up appointments reviewed:  PCP Hospital f/u appt confirmed? No   Specialist Hospital f/u appt confirmed? No   Are transportation arrangements needed? No  If their condition worsens, is the pt aware to call PCP or go to the Emergency Dept.? Yes Was the patient provided with contact information for the PCP's office or ED? Yes Was to pt encouraged to call back with questions or concerns? Yes

## 2022-08-23 ENCOUNTER — Other Ambulatory Visit: Payer: Self-pay

## 2022-08-23 ENCOUNTER — Emergency Department (HOSPITAL_BASED_OUTPATIENT_CLINIC_OR_DEPARTMENT_OTHER)
Admission: EM | Admit: 2022-08-23 | Discharge: 2022-08-23 | Disposition: A | Discharge status: Home / Self Care | Payer: Managed Care, Other (non HMO) | Attending: Emergency Medicine | Admitting: Emergency Medicine

## 2022-08-23 ENCOUNTER — Encounter (HOSPITAL_BASED_OUTPATIENT_CLINIC_OR_DEPARTMENT_OTHER): Payer: Self-pay | Admitting: Emergency Medicine

## 2022-08-23 DIAGNOSIS — L500 Allergic urticaria: Secondary | ICD-10-CM | POA: Diagnosis not present

## 2022-08-23 DIAGNOSIS — T7840XA Allergy, unspecified, initial encounter: Secondary | ICD-10-CM | POA: Diagnosis present

## 2022-08-23 MED ORDER — PREDNISONE 20 MG PO TABS: 40.0000 mg | ORAL_TABLET | Freq: Every day | ORAL | 0 refills | Status: AC

## 2022-08-23 MED ORDER — PREDNISONE 50 MG PO TABS
60.0000 mg | ORAL_TABLET | Freq: Once | ORAL | Status: AC
  Administered 2022-08-23: 60 mg via ORAL
  Filled 2022-08-23: qty 1

## 2022-08-23 NOTE — ED Provider Notes (Signed)
[facility] Provider Note   CSN: [id number] Arrival date & time: 08/23/22  2122     History  Chief Complaint  Patient presents with   Allergic Reaction    Jasmine Wyatt is a 22 y.o. female.  Patient here with hives on arms and upper thighs for the last couple days.  Has been using Benadryl with no big improvement.  No known exposure.  No history of the same.  She has no difficulty breathing, no nausea no vomiting no diarrhea.  No recent illness.  The history is provided by the patient.       Home Medications Prior to Admission medications   Medication Sig Start Date End Date Taking? Authorizing Provider  predniSONE (DELTASONE) 20 MG tablet Take 2 tablets (40 mg total) by mouth daily for 3 days. 08/23/22 08/26/22 Yes Marionette Meskill, DO  escitalopram (LEXAPRO) 10 MG tablet Take 0.5 tablets (5 mg total) by mouth daily for 14 days, THEN 1 tablet (10 mg total) daily for 16 days. 02/20/22 03/22/22  Hali Marry, MD  metoCLOPramide (REGLAN) 10 MG tablet Take 1 tablet (10 mg total) by mouth every 8 (eight) hours as needed for nausea. 07/03/22   Davonna Belling, MD  Norgestimate-Ethinyl Estradiol Triphasic 0.18/0.215/0.25 MG-25 MCG tab Take 1 tablet by mouth daily. 02/20/22   Hali Marry, MD      Allergies    Patient has no known allergies.    Review of Systems   Review of Systems  Physical Exam Updated Vital Signs BP (!) 129/91 (BP Location: Right Arm)   Pulse 73   Temp 98.3 F (36.8 C) (Oral)   Resp 18   Ht 5' 3"$  (1.6 m)   Wt 56.7 kg   LMP 08/23/2022   SpO2 100%   BMI 22.14 kg/m  Physical Exam Vitals and nursing note reviewed.  Constitutional:      General: She is not in acute distress.    Appearance: She is well-developed.  HENT:     Head: Normocephalic and atraumatic.  Eyes:     Conjunctiva/sclera: Conjunctivae normal.  Cardiovascular:     Rate and Rhythm: Normal rate and regular rhythm.      Heart sounds: No murmur heard. Pulmonary:     Effort: Pulmonary effort is normal. No respiratory distress.     Breath sounds: Normal breath sounds.  Abdominal:     Palpations: Abdomen is soft.     Tenderness: There is no abdominal tenderness.  Musculoskeletal:        General: No swelling.     Cervical back: Neck supple.  Skin:    General: Skin is warm and dry.     Capillary Refill: Capillary refill takes less than 2 seconds.     Findings: Rash present.     Comments: Hives to her forearms and upper thighs  Neurological:     Mental Status: She is alert.  Psychiatric:        Mood and Affect: Mood normal.     ED Results / Procedures / Treatments   Labs (all labs ordered are listed, but only abnormal results are displayed) Labs Reviewed - No data to display  EKG None  Radiology No results found.  Procedures Procedures    Medications Ordered in ED Medications  predniSONE (DELTASONE) tablet 60 mg (has no administration in time range)    ED Course/ Medical Decision Making/ A&P  Medical Decision Making Risk Prescription drug management.   Eugenie Birks is here with hives.  Normal vitals.  No fever.  No concern for anaphylaxis.  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  Will prescribe prednisone.  She has follow-up with primary care doctor next week.  Continue using Benadryl as needed.  This does not appear to be a dangerous rash and is consistent with allergic hives.  No concern for anaphylaxis or Stevens-Johnson syndrome or other emergent process.  Per chart review she had an abortion a couple months ago.  She just had her cycle recently and is not concerned about pregnancy.  Will prescribe prednisone.  Discharged in good condition.  Understands return precautions.  This chart was dictated using voice recognition software.  Despite best efforts to proofread,  errors can occur which can change the documentation meaning.         Final  Clinical Impression(s) / ED Diagnoses Final diagnoses:  Allergic reaction, initial encounter    Rx / DC Orders ED Discharge Orders          Ordered    predniSONE (DELTASONE) 20 MG tablet  Daily        08/23/22 2141              Lennice Sites, DO 08/23/22 2143

## 2022-08-23 NOTE — Discharge Instructions (Signed)
Take next dose of prednisone tomorrow.  Recommend using 25 mg of Benadryl every 6-8 hours as needed for itching as well.

## 2022-08-23 NOTE — ED Notes (Signed)
RN reviewed discharge instructions w/ pt. Follow up and prescriptions reviewed, pt had no further questions °

## 2022-08-23 NOTE — ED Triage Notes (Signed)
Patient reports she has been itching for the last 5 days, states she has hives all over her body. Patient has taken benadryl without any improvement. No shortness of breath or difficulty swallowing.

## 2022-08-26 ENCOUNTER — Telehealth: Payer: Self-pay | Admitting: General Practice

## 2022-08-26 NOTE — Transitions of Care (Post Inpatient/ED Visit) (Signed)
   08/26/2022  Name: Jasmine Wyatt MRN: [id number] DOB: 24-Feb-2001  Today's TOC FU Call Status: Today's TOC FU Call Status:: Unsuccessul Call (1st Attempt) Unsuccessful Call (1st Attempt) Date: 08/26/22  Attempted to reach the patient regarding the most recent Inpatient/ED visit.  Follow Up Plan: Additional outreach attempts will be made to reach the patient to complete the Transitions of Care (Post Inpatient/ED visit) call.   Signature Tinnie Gens, Therapist, sports, BSN

## 2022-08-27 ENCOUNTER — Telehealth (INDEPENDENT_AMBULATORY_CARE_PROVIDER_SITE_OTHER): Payer: BC Managed Care – PPO | Admitting: Family Medicine

## 2022-08-27 ENCOUNTER — Encounter: Payer: Self-pay | Admitting: Family Medicine

## 2022-08-27 DIAGNOSIS — Z3009 Encounter for other general counseling and advice on contraception: Secondary | ICD-10-CM

## 2022-08-27 DIAGNOSIS — L509 Urticaria, unspecified: Secondary | ICD-10-CM | POA: Diagnosis not present

## 2022-08-27 MED ORDER — NORGESTIM-ETH ESTRAD TRIPHASIC 0.18/0.215/0.25 MG-25 MCG PO TABS: 1.0000 | ORAL_TABLET | Freq: Every day | ORAL | 3 refills | Status: DC

## 2022-08-27 NOTE — Progress Notes (Signed)
   Acute Office Visit  Subjective:     Patient ID: Jasmine Wyatt, female    DOB: 2001/06/24, 22 y.o.   MRN: [id number]  No chief complaint on file.   HPI Patient is in today to discuss birth control.  She had previously tried to take a pill that we had prescribed back in the summer but only took it for a month but never continued it.  She would like to try it again and try to stay on it longer.  She also wanted to discuss itching and hives.  She has been having episodes of itching on her chest back and legs that have been coming and going.  She thought maybe her skin was just extra dry and irritated but then a few days ago she actually broke out in hives on her face and lips and chest and had to go to the emergency department.  She was treated with steroids.  She has no known allergies but she says her brother is allergic to "everything".  Would like to be evaluated by an allergist.  ROS      Objective:    LMP 08/23/2022    Physical Exam Vitals reviewed.  Constitutional:      Appearance: She is well-developed.  HENT:     Head: Normocephalic and atraumatic.  Pulmonary:     Effort: Pulmonary effort is normal.  Skin:    Coloration: Skin is not pale.  Neurological:     Mental Status: She is alert and oriented to person, place, and time.  Psychiatric:        Behavior: Behavior normal.     No results found for any visits on 08/27/22.      Assessment & Plan:   Problem List Items Addressed This Visit       Other   Encounter for counseling regarding contraception   Relevant Medications   Norgestimate-Ethinyl Estradiol Triphasic 0.18/0.215/0.25 MG-25 MCG tab   Other Visit Diagnoses     Hives    -  Primary   Relevant Orders   Ambulatory referral to Allergy   Advised about oral contraception          Will restart oral birth control.  We did discuss that we can always switch methods or types of pills if she does not tolerate well.  But I would encourage her to try  it for at least 90 days.  Hives-we will go ahead and place referral to an allergist.  Meds ordered this encounter  Medications   Norgestimate-Ethinyl Estradiol Triphasic 0.18/0.215/0.25 MG-25 MCG tab    Sig: Take 1 tablet by mouth daily.    Dispense:  84 tablet    Refill:  3    No follow-ups on file.  Beatrice Lecher, MD

## 2022-08-27 NOTE — Progress Notes (Signed)
Pt would like to restart the Triphasic.pt was advised that she would need to use condoms.

## 2022-08-29 NOTE — Transitions of Care (Post Inpatient/ED Visit) (Signed)
   08/29/2022  Name: Jasmine Wyatt MRN: [id number] DOB: [birth year]  Today's TOC FU Call Status: Today's TOC FU Call Status:: Unsuccessful Call (2nd Attempt) Unsuccessful Call (1st Attempt) Date: 08/26/22 Unsuccessful Call (2nd Attempt) Date: 08/29/22  Attempted to reach the patient regarding the most recent Inpatient/ED visit.  Follow Up Plan: Additional outreach attempts will be made to reach the patient to complete the Transitions of Care (Post Inpatient/ED visit) call.   Signature Tinnie Gens, Therapist, sports, BSN

## 2022-08-30 NOTE — Transitions of Care (Post Inpatient/ED Visit) (Signed)
   08/30/2022  Name: Jasmine Wyatt MRN: [id number] DOB: [birth year]  Today's TOC FU Call Status: Today's TOC FU Call Status:: Unsuccessful Call (3rd Attempt) Unsuccessful Call (1st Attempt) Date: 08/26/22 Unsuccessful Call (2nd Attempt) Date: 08/29/22 Unsuccessful Call (3rd Attempt) Date: 08/30/22  Attempted to reach the patient regarding the most recent Inpatient/ED visit.  Follow Up Plan: No further outreach attempts will be made at this time. We have been unable to contact the patient.  Signature Tinnie Gens, RN BSN

## 2022-09-11 ENCOUNTER — Encounter: Payer: Self-pay | Admitting: Internal Medicine

## 2022-09-11 ENCOUNTER — Ambulatory Visit (INDEPENDENT_AMBULATORY_CARE_PROVIDER_SITE_OTHER): Payer: BC Managed Care – PPO | Admitting: Internal Medicine

## 2022-09-11 VITALS — BP 116/60 | HR 98 | Temp 98.0°F | Resp 16 | Ht 63.0 in | Wt 128.0 lb

## 2022-09-11 DIAGNOSIS — L501 Idiopathic urticaria: Secondary | ICD-10-CM | POA: Diagnosis not present

## 2022-09-11 DIAGNOSIS — T783XXD Angioneurotic edema, subsequent encounter: Secondary | ICD-10-CM

## 2022-09-11 DIAGNOSIS — J3089 Other allergic rhinitis: Secondary | ICD-10-CM | POA: Diagnosis not present

## 2022-09-11 MED ORDER — HYDROCORTISONE 2.5 % EX OINT: TOPICAL_OINTMENT | Freq: Two times a day (BID) | CUTANEOUS | 0 refills | Status: DC

## 2022-09-11 MED ORDER — FLUTICASONE PROPIONATE 50 MCG/ACT NA SUSP: 1.0000 | Freq: Every day | NASAL | 2 refills | Status: DC

## 2022-09-11 MED ORDER — CETIRIZINE HCL 10 MG PO TABS: 10.0000 mg | ORAL_TABLET | Freq: Every day | ORAL | 5 refills | Status: DC

## 2022-09-11 NOTE — Progress Notes (Signed)
New Patient Note  RE: Jasmine Wyatt MRN: [id number] DOB: [birth year] Date of Office Visit: 09/11/2022  Consult requested by: Jasmine Amy, NP Primary care provider: Hali Marry, MD  Chief Complaint: Urticaria (Pt started itching about a month ago and than she broke into hives that lasted 3 days, since than she haven't stop itching and the hives comes and go not as severe as the first initial visit.)  History of Present Illness: I had the pleasure of seeing Jasmine Wyatt for initial evaluation at the [facility] on 09/11/2022. She is a 22 y.o. female, who is referred here by Jasmine Marry, MD for the evaluation of hives .  History obtained from patient, chart review and mother, Jasmine Wyatt.   Urticaria  Symptoms started a week and a half ago, lasted for 3 days. Prior to that she had 2 weeks of pruritus associated with dermatogrpahia Symptoms worsen hot showers  Pictures consistent with urticaria. On day one of symptoms she took benadryl without good response.  She presented to ED on day 2 due to associated lip swelling.  ED treatment: prednisone for 3 days.   Urticaria resolved and didn't recur.  No preceding illness or vaccines.    She has a prior history of idiopathic angioedema of the lips which she was seen by [place] in 2017.  Workup detailed below.  At that time she also complained of chronic rhinitis and dyspnea/cough with exercise.  Today she denies any other symptoms.  Pertinent History/Diagnostics:  - Asthma: no formal diagnosis, previously given albuterol of EIB, cough  - normal spirometry (12/20/15): ratio 90%, 1.77L,101% FEV1 (pre),   - AEC (10/01/21) 200,  - Allergic Rhinitis:   - SPT environmental panel (12/29/25): grass pollen, weed pollen, ragweed pollen, tree pollen, mold, cat hair, dog epithelia, dust mite, cockroach  - Food Allergy (unknown)  - Hx of reaction: lip angioedema- unclear etiology from last visit   - SPT select foods  (12/20/15): negative peds food panel   Assessment and Plan: Jasmine Wyatt is a 22 y.o. female with: Idiopathic urticaria - Plan: Allergy Test, Chronic Urticaria, CMP14+EGFR, Tryptase, CBC With Differential, Alpha-Gal Panel, Sed Rate (ESR), Thyroid Peroxidase Antibody, Rabbit Epithelia IgE, TSH  Angioedema, subsequent encounter - Plan: Allergy Test, Chronic Urticaria, CMP14+EGFR, Tryptase, CBC With Differential, Alpha-Gal Panel, Sed Rate (ESR), Thyroid Peroxidase Antibody, Rabbit Epithelia IgE, TSH  Seasonal and perennial allergic rhinoconjunctivitis - Plan: Interdermal Allergy Test   Plan: Patient Instructions  Idiopathic Urticaria: Allergy testing today showed:  - hives can be from a number of different sources including infections, allergies, vibration, temperature, pressure among many others other possible causes - often an identifiable cause is not determined - some potential triggers include: stress, illness, NSAIDs, aspirin, hormonal changes - you do not have any red flag symptoms to make [place] concerned about secondary causes of hives, but we will screen for these for reassurance with: CBC w diff, CMP, tryptase, TSH, hive panel, alpha-gal panel, inflammatory markers - approximately 50% of patients with chronic hives can have some associated swelling of the face/lips/eyelids (this is not a cause for alarm and does not typically progress onto systemic allergic reactions)  Therapy Plan:  - start zyrtec (cetirizine) '10mg'$  once daily - if hives are uncontrolled, increase zyrtec (cetirizine) to '10mg'$  twice daily - if hives remain uncontrolled, increase dose of zyrtec (cetirizine) to max dose of '20mg'$  (2 pills) twice daily- this is maximum dose - can increase or decrease dosing depending on  symptom control to a maximum dose of 4 tablets of antihistamine daily. Wait until hives free for at least one month prior to decreasing dose.   - if hives are still uncontrolled with the above regimen, please  arrange an appointment for discussion of Xolair (omalizumab)- an injectable medication for hives  Can use one of the following in place of zyrtec if desires: Claritin (loratadine) 10 mg, Xyzal (levocetirizine) 5 mg or Allegra (fexofenadine) 180 mg daily as needed   Chronic Rhinitis Seasonal and Perennial Allergic: suspect poor perceiver and less controlled than realized  - allergy testing today: Positive grass pollen, Tree Pollen, weed pollen, mold, cat  - Prevention:  - allergen avoidance when possible - consider allergy shots as long term control of your symptoms by teaching your immune system to be more tolerant of your allergy triggers  - Symptom control: - Consider Nasal Steroid Spray: Best results if used daily. - Options include Flonase (fluticasone), Nasocort (triamcinolone), Nasonex (mometasome) 1- 2 sprays in each nostril daily.  - All can be purchased over-the-counter if not covered by insurance. - Start Antihistamine: daily or daily as needed.   -Options include Zyrtec (Cetirizine) '10mg'$ , Claritin (Loratadine) '10mg'$ , Allegra (Fexofenadine) '180mg'$ , or Xyzal (Levocetirinze) '5mg'$  - Can be purchased over-the-counter if not covered by insurance.  Allergic Conjunctivitis:  - Start Allergy Eye drops-great options include Pataday (Olopatadine) or Zaditor (ketotifen) for eye symptoms daily as needed-both sold over the counter if not covered by insurance. and Rewetting Drops such as Systane,TheraTears, etc  -Avoid eye drops that say red eye relief as they may contain medications that dry out your eyes.   Follow up: 3 months  We will call you with lab results    Reducing Pollen Exposure  The American Academy of Allergy, Asthma and Immunology suggests the following steps to reduce your exposure to pollen during allergy seasons.    Do not hang sheets or clothing out to dry; pollen may collect on these items. Do not mow lawns or spend time around freshly cut grass; mowing stirs up  pollen. Keep windows closed at night.  Keep car windows closed while driving. Minimize morning activities outdoors, a time when pollen counts are usually at their highest. Stay indoors as much as possible when pollen counts or humidity is high and on windy days when pollen tends to remain in the air longer. Use air conditioning when possible.  Many air conditioners have filters that trap the pollen spores. Use a HEPA room air filter to remove pollen form the indoor air you breathe.  Control of Dog or Cat Allergen  Avoidance is the best way to manage a dog or cat allergy. If you have a dog or cat and are allergic to dog or cats, consider removing the dog or cat from the home. If you have a dog or cat but don't want to find it a new home, or if your family wants a pet even though someone in the household is allergic, here are some strategies that may help keep symptoms at bay:  Keep the pet out of your bedroom and restrict it to only a few rooms. Be advised that keeping the dog or cat in only one room will not limit the allergens to that room. Don't pet, hug or kiss the dog or cat; if you do, wash your hands with soap and water. High-efficiency particulate air (HEPA) cleaners run continuously in a bedroom or living room can reduce allergen levels over time. Regular use of a  high-efficiency vacuum cleaner or a central vacuum can reduce allergen levels. Giving your dog or cat a bath at least once a week can reduce airborne allergen.  Control of Mold Allergen   Mold and fungi can grow on a variety of surfaces provided certain temperature and moisture conditions exist.  Outdoor molds grow on plants, decaying vegetation and soil.  The major outdoor mold, Alternaria and Cladosporium, are found in very high numbers during hot and dry conditions.  Generally, a late Summer - Fall peak is seen for common outdoor fungal spores.  Rain will temporarily lower outdoor mold spore count, but counts rise rapidly  when the rainy period ends.  The most important indoor molds are Aspergillus and Penicillium.  Dark, humid and poorly ventilated basements are ideal sites for mold growth.  The next most common sites of mold growth are the bathroom and the kitchen.  Outdoor (Seasonal) Mold Control   Use air conditioning and keep windows closed Avoid exposure to decaying vegetation. Avoid leaf raking. Avoid grain handling. Consider wearing a face mask if working in moldy areas.    Indoor (Perennial) Mold Control    Maintain humidity below 50%. Clean washable surfaces with 5% bleach solution. Remove sources e.g. contaminated carpets.      Meds ordered this encounter  Medications   cetirizine (ZYRTEC) 10 MG tablet    Sig: Take 1 tablet (10 mg total) by mouth daily.    Dispense:  30 tablet    Refill:  5   hydrocortisone 2.5 % ointment    Sig: Apply topically 2 (two) times daily.    Dispense:  30 g    Refill:  0   Lab Orders         Chronic Urticaria         CMP14+EGFR         Tryptase         CBC With Differential         Alpha-Gal Panel         Sed Rate (ESR)         Thyroid Peroxidase Antibody         Rabbit Epithelia IgE         TSH      Other allergy screening: Asthma:  as above Rhino conjunctivitis:  as above  Food allergy: no Medication allergy: no Hymenoptera allergy: no Urticaria: yes Eczema:no History of recurrent infections suggestive of immunodeficency: no  Diagnostics: Skin Testing: Environmental allergy panel and select foods.  Epicutaneous Testing: Positive to grass, trees, molds, cat  Intradermal Testing: Positive to grass mix, [place] grass, RW, weed   adequate controls  Results interpreted by myself and discussed with patient/family.  Airborne Adult Perc - 09/11/22 1349     Time Antigen Placed 1349    Allergen Manufacturer Lavella Hammock    Location Back    Number of Test 59    Panel 1 Select    1. Control-Buffer 50% Glycerol Negative    2. Control-Histamine 1  mg/ml 4+    3. Albumin saline Negative    4. Bovina Negative    5. [place] 2+    6. Johnson Negative    7. Blair Blue Negative    8. Meadow Fescue Negative    9. Perennial Rye Negative    10. Sweet Vernal Negative    11. Timothy Negative    12. Cocklebur Negative    13. Burweed Marshelder Negative    14. Ragweed, short Negative  15. Ragweed, Giant Negative    16. Plantain,  English Negative    17. Lamb's Quarters Negative    18. Sheep Sorrell Negative    19. Rough Pigweed Negative    20. Marsh Elder, Rough Negative    21. Mugwort, Common Negative    22. Ash mix Negative    23. Birch mix 2+    24. Beech American 2+    25. Box, Elder Negative    26. Cedar, red Negative    27. Cottonwood, [place] Negative    28. Elm mix Negative    29. Hickory Negative    30. Maple mix Negative    31. Oak, [place] mix 3+    32. Pecan Pollen Negative    33. Pine mix 3+    34. Sycamore Eastern 2+    35. [place], Black Pollen Negative    36. Alternaria alternata 3+    37. Cladosporium Herbarum Negative    38. Aspergillus mix 3+    39. Penicillium mix 3+    40. Bipolaris sorokiniana (Helminthosporium) 3+    41. Drechslera spicifera (Curvularia) Negative    42. Mucor plumbeus 3+    43. Fusarium moniliforme 3+    44. Aureobasidium pullulans (pullulara) 3+    45. Rhizopus oryzae 3+    46. Botrytis cinera 2+    47. Epicoccum nigrum 3+    48. Phoma betae 3+    49. Candida Albicans Negative    50. Trichophyton mentagrophytes Negative    51. Mite, D Farinae  5,000 AU/ml Negative    52. Mite, D Pteronyssinus  5,000 AU/ml Negative    53. Cat Hair 10,000 BAU/ml 4+    54.  Dog Epithelia Negative    55. Mixed Feathers Negative    56. Horse Epithelia Negative    57. Cockroach, German Negative    58. Mouse Negative    59. Tobacco Leaf Negative             Food Perc - 09/11/22 1348       Test Information   Time Antigen Placed [id number]    Allergen Manufacturer Lavella Hammock    Location Back     Number of allergen test [place]   1. Peanut Negative    2. Soybean food Negative    3. Wheat, whole Negative    4. Sesame Negative    5. Milk, cow Negative    6. Egg White, chicken Negative    7. Casein Negative    8. Shellfish mix Negative    9. Fish mix Negative    10. Cashew Negative             Intradermal - 09/11/22 1525     Time Antigen Placed 1525    Allergen Manufacturer Lavella Hammock    Location Arm    Number of Test 8    Control Negative    Johnson 3+    7 Grass 3+    Ragweed mix 3+    Weed mix 3+    Dog Negative    Cockroach Negative    Mite mix Negative             Past Medical History: Patient Active Problem List   Diagnosis Date Noted   Vaginal discharge 04/10/2022   Possible exposure to STD 04/10/2022   GAD (generalized anxiety disorder) 02/20/2022   Fracture of great toe, right, closed 08/22/2021   IDA (iron deficiency anemia)  01/26/2021   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 No pertinent past medical history. Past Surgical History: Past Surgical History:  Procedure Laterality Date   NO PAST SURGERIES     ROOT CANAL  10/2015   Medication List:  Current Outpatient Medications  Medication Sig Dispense Refill   cetirizine (ZYRTEC) 10 MG tablet Take 1 tablet (10 mg total) by mouth daily. 30 tablet 5   hydrocortisone 2.5 % ointment Apply topically 2 (two) times daily. 30 g 0   Norgestimate-Ethinyl Estradiol Triphasic 0.18/0.215/0.25 MG-25 MCG tab Take 1 tablet by mouth daily. 84 tablet 3   No current facility-administered medications for this visit.   Allergies: No Known Allergies Social History: Social History   Socioeconomic History   Marital status: Single     Spouse name: Not on file   Number of children: 0   Years of education: Not on file   Highest education level: Not on file  Occupational History   Occupation: student  Tobacco Use   Smoking status: Never    Passive exposure: Never   Smokeless tobacco: Never  Vaping Use   Vaping Use: Never used  Substance and Sexual Activity   Alcohol use: Yes    Comment: socially   Drug use: Not Currently   Sexual activity: Yes    Birth control/protection: Condom  Other Topics Concern   Not on file  Social History Narrative   She currently lives with her mother starlet and her brother Delos Haring. Her parents are separated. Her mother is a Diplomatic Services operational officer and her father is a Insurance claims handler. She runs track in the fall in the spring and plans on being a fashion designer   Social Determinants of Health   Financial Resource Strain: Not on file  Food Insecurity: Not on file  Transportation Needs: Not on file  Physical Activity: Not on file  Stress: Not on file  Social Connections: Not on file   Lives in a single-family home that is built in [place] there are no roaches in the house and bed is 2 feet off the floor.  No dust mite precautions.  Not exposed to fumes, chemicals or dust.. Smoking: No exposure Occupation: Works as a IT sales professional HistoryFreight forwarder in the house: no Charity fundraiser in the family room: yes Carpet in the bedroom: yes Heating: gas Cooling: central Pet: yes rabbit with access to bedroom  Family History: Family History  Problem Relation Age of Onset   Hypertension Father    Food Allergy Brother        peanut, shellfish, fish   Allergic rhinitis Brother    Cancer Maternal Grandmother        lung   Hypertension Maternal Grandmother    Asthma Cousin    Eczema Cousin    Angioedema Neg Hx    Atopy Neg Hx    Immunodeficiency Neg Hx    Urticaria Neg Hx      ROS: All others negative except as noted per HPI.   Objective: BP 116/60   Pulse 98   Temp 98 F (36.7 C)  (Temporal)   Resp 16   Ht '5\' 3"'$  (1.6 m)   Wt 128 lb (58.1 kg)   LMP 08/23/2022   SpO2 99%   BMI 22.67 kg/m  Body  mass index is 22.67 kg/m.  General Appearance:  Alert, cooperative, no distress, appears stated age  Head:  Normocephalic, without obvious abnormality, atraumatic  Eyes:  Conjunctiva clear, EOM's intact  Nose: Nares normal, normal mucosa, no visible anterior polyps, and septum midline  Throat: Lips, tongue normal; teeth and gums normal, normal posterior oropharynx  Neck: Supple, symmetrical  Lungs:   clear to auscultation bilaterally, Respirations unlabored, no coughing  Heart:  regular rate and rhythm and no murmur, Appears well perfused  Extremities: No edema  Skin: Skin color, texture, turgor normal, no rashes or lesions on visualized portions of skin  Neurologic: No gross deficits   The plan was reviewed with the patient/family, and all questions/concerned were addressed.  It was my pleasure to see Jasmine Wyatt today and participate in her care. Please feel free to contact me with any questions or concerns.  Sincerely,  Roney Marion, MD Allergy & Immunology  [facility] office: [phone] [facility] office: [phone]

## 2022-09-11 NOTE — Patient Instructions (Addendum)
Idiopathic Urticaria: Allergy testing today showed:  - hives can be from a number of different sources including infections, allergies, vibration, temperature, pressure among many others other possible causes - often an identifiable cause is not determined - some potential triggers include: stress, illness, NSAIDs, aspirin, hormonal changes - you do not have any red flag symptoms to make [place] concerned about secondary causes of hives, but we will screen for these for reassurance with: CBC w diff, CMP, tryptase, TSH, hive panel, alpha-gal panel, inflammatory markers - approximately 50% of patients with chronic hives can have some associated swelling of the face/lips/eyelids (this is not a cause for alarm and does not typically progress onto systemic allergic reactions)  Therapy Plan:  - start zyrtec (cetirizine) '10mg'$  once daily - if hives are uncontrolled, increase zyrtec (cetirizine) to '10mg'$  twice daily - if hives remain uncontrolled, increase dose of zyrtec (cetirizine) to max dose of '20mg'$  (2 pills) twice daily- this is maximum dose - can increase or decrease dosing depending on symptom control to a maximum dose of 4 tablets of antihistamine daily. Wait until hives free for at least one month prior to decreasing dose.   - if hives are still uncontrolled with the above regimen, please arrange an appointment for discussion of Xolair (omalizumab)- an injectable medication for hives  Can use one of the following in place of zyrtec if desires: Claritin (loratadine) 10 mg, Xyzal (levocetirizine) 5 mg or Allegra (fexofenadine) 180 mg daily as needed   Chronic Rhinitis Seasonal and Perennial Allergic: suspect poor perceiver and less controlled than realized  - allergy testing today: Positive grass pollen, Tree Pollen, weed pollen, mold, cat  - Prevention:  - allergen avoidance when possible - consider allergy shots as long term control of your symptoms by teaching your immune system to be more tolerant  of your allergy triggers  - Symptom control: - Consider Nasal Steroid Spray: Best results if used daily. - Options include Flonase (fluticasone), Nasocort (triamcinolone), Nasonex (mometasome) 1- 2 sprays in each nostril daily.  - All can be purchased over-the-counter if not covered by insurance. - Start Antihistamine: daily or daily as needed.   -Options include Zyrtec (Cetirizine) '10mg'$ , Claritin (Loratadine) '10mg'$ , Allegra (Fexofenadine) '180mg'$ , or Xyzal (Levocetirinze) '5mg'$  - Can be purchased over-the-counter if not covered by insurance.  Allergic Conjunctivitis:  - Start Allergy Eye drops-great options include Pataday (Olopatadine) or Zaditor (ketotifen) for eye symptoms daily as needed-both sold over the counter if not covered by insurance. and Rewetting Drops such as Systane,TheraTears, etc  -Avoid eye drops that say red eye relief as they may contain medications that dry out your eyes.   Follow up: 3 months  We will call you with lab results    Reducing Pollen Exposure  The American Academy of Allergy, Asthma and Immunology suggests the following steps to reduce your exposure to pollen during allergy seasons.    Do not hang sheets or clothing out to dry; pollen may collect on these items. Do not mow lawns or spend time around freshly cut grass; mowing stirs up pollen. Keep windows closed at night.  Keep car windows closed while driving. Minimize morning activities outdoors, a time when pollen counts are usually at their highest. Stay indoors as much as possible when pollen counts or humidity is high and on windy days when pollen tends to remain in the air longer. Use air conditioning when possible.  Many air conditioners have filters that trap the pollen spores. Use a HEPA room air filter  to remove pollen form the indoor air you breathe.  Control of Dog or Cat Allergen  Avoidance is the best way to manage a dog or cat allergy. If you have a dog or cat and are allergic to dog or  cats, consider removing the dog or cat from the home. If you have a dog or cat but don't want to find it a new home, or if your family wants a pet even though someone in the household is allergic, here are some strategies that may help keep symptoms at bay:  Keep the pet out of your bedroom and restrict it to only a few rooms. Be advised that keeping the dog or cat in only one room will not limit the allergens to that room. Don't pet, hug or kiss the dog or cat; if you do, wash your hands with soap and water. High-efficiency particulate air (HEPA) cleaners run continuously in a bedroom or living room can reduce allergen levels over time. Regular use of a high-efficiency vacuum cleaner or a central vacuum can reduce allergen levels. Giving your dog or cat a bath at least once a week can reduce airborne allergen.  Control of Mold Allergen   Mold and fungi can grow on a variety of surfaces provided certain temperature and moisture conditions exist.  Outdoor molds grow on plants, decaying vegetation and soil.  The major outdoor mold, Alternaria and Cladosporium, are found in very high numbers during hot and dry conditions.  Generally, a late Summer - Fall peak is seen for common outdoor fungal spores.  Rain will temporarily lower outdoor mold spore count, but counts rise rapidly when the rainy period ends.  The most important indoor molds are Aspergillus and Penicillium.  Dark, humid and poorly ventilated basements are ideal sites for mold growth.  The next most common sites of mold growth are the bathroom and the kitchen.  Outdoor (Seasonal) Mold Control   Use air conditioning and keep windows closed Avoid exposure to decaying vegetation. Avoid leaf raking. Avoid grain handling. Consider wearing a face mask if working in moldy areas.    Indoor (Perennial) Mold Control    Maintain humidity below 50%. Clean washable surfaces with 5% bleach solution. Remove sources e.g. contaminated  carpets.

## 2022-09-13 ENCOUNTER — Encounter: Payer: Self-pay | Admitting: Family Medicine

## 2022-09-20 ENCOUNTER — Ambulatory Visit (INDEPENDENT_AMBULATORY_CARE_PROVIDER_SITE_OTHER): Payer: BC Managed Care – PPO | Admitting: Family Medicine

## 2022-09-20 ENCOUNTER — Encounter: Payer: Self-pay | Admitting: Family Medicine

## 2022-09-20 VITALS — BP 112/67 | HR 74 | Ht 62.0 in | Wt 130.0 lb

## 2022-09-20 DIAGNOSIS — R768 Other specified abnormal immunological findings in serum: Secondary | ICD-10-CM

## 2022-09-20 DIAGNOSIS — Z23 Encounter for immunization: Secondary | ICD-10-CM | POA: Diagnosis not present

## 2022-09-20 DIAGNOSIS — L508 Other urticaria: Secondary | ICD-10-CM | POA: Diagnosis not present

## 2022-09-20 LAB — CMP14+EGFR
ALT: 14 IU/L (ref 0–32)
AST: 18 IU/L (ref 0–40)
Albumin/Globulin Ratio: 1.5 (ref 1.2–2.2)
Albumin: 4.3 g/dL (ref 4.0–5.0)
Alkaline Phosphatase: 54 IU/L (ref 44–121)
BUN/Creatinine Ratio: 10 (ref 9–23)
BUN: 9 mg/dL (ref 6–20)
Bilirubin Total: 0.3 mg/dL (ref 0.0–1.2)
CO2: 22 mmol/L (ref 20–29)
Calcium: 9.3 mg/dL (ref 8.7–10.2)
Chloride: 105 mmol/L (ref 96–106)
Creatinine, Ser: 0.9 mg/dL (ref 0.57–1.00)
Globulin, Total: 2.9 g/dL (ref 1.5–4.5)
Glucose: 58 mg/dL — ABNORMAL LOW (ref 70–99)
Potassium: 4.4 mmol/L (ref 3.5–5.2)
Sodium: 142 mmol/L (ref 134–144)
Total Protein: 7.2 g/dL (ref 6.0–8.5)
eGFR: 93 mL/min/{1.73_m2} (ref >59)

## 2022-09-20 LAB — CBC WITH DIFFERENTIAL
Basophils Absolute: 0 10*3/uL (ref 0.0–0.2)
Basos: 0 %
EOS (ABSOLUTE): 0.1 10*3/uL (ref 0.0–0.4)
Eos: 1 %
Hematocrit: 42.2 % (ref 34.0–46.6)
Hemoglobin: 13.9 g/dL (ref 11.1–15.9)
Immature Grans (Abs): 0 10*3/uL (ref 0.0–0.1)
Immature Granulocytes: 0 %
Lymphocytes Absolute: 1.6 10*3/uL (ref 0.7–3.1)
Lymphs: 30 %
MCH: 28.6 pg (ref 26.6–33.0)
MCHC: 32.9 g/dL (ref 31.5–35.7)
MCV: 87 fL (ref 79–97)
Monocytes Absolute: 0.3 10*3/uL (ref 0.1–0.9)
Monocytes: 5 %
Neutrophils Absolute: 3.5 10*3/uL (ref 1.4–7.0)
Neutrophils: 64 %
RBC: 4.86 x10E6/uL (ref 3.77–5.28)
RDW: 12 % (ref 11.7–15.4)
WBC: 5.5 10*3/uL (ref 3.4–10.8)

## 2022-09-20 LAB — TRYPTASE: Tryptase: 7.6 ug/L (ref 2.2–13.2)

## 2022-09-20 LAB — ALPHA-GAL PANEL
Allergen Lamb IgE: <0.1 kU/L
Beef IgE: <0.1 kU/L
IgE (Immunoglobulin E), Serum: 100 IU/mL (ref 6–495)
O215-IgE Alpha-Gal: <0.1 kU/L
Pork IgE: <0.1 kU/L

## 2022-09-20 LAB — SEDIMENTATION RATE: Sed Rate: 15 mm/hr (ref 0–32)

## 2022-09-20 LAB — TSH: TSH: 2.52 u[IU]/mL (ref 0.450–4.500)

## 2022-09-20 LAB — E082-IGE RABBIT EPITHELIA: Rabbit Epithelia IgE: <0.1 kU/L

## 2022-09-20 LAB — CHRONIC URTICARIA: cu index: 19.8 — ABNORMAL HIGH (ref <10)

## 2022-09-20 LAB — THYROID PEROXIDASE ANTIBODY: Thyroperoxidase Ab SerPl-aCnc: 132 IU/mL — ABNORMAL HIGH (ref 0–34)

## 2022-09-20 NOTE — Progress Notes (Signed)
   Acute Office Visit  Subjective:     Patient ID: Jasmine Wyatt, female    DOB: 2000-08-01, 22 y.o.   MRN: [id number]  Chief Complaint  Patient presents with   Follow-up         HPI Patient is in today for recent abnormal labs.  She was being evaluated by allergy and asthma for idiopathic urticaria and angioedema and had some recent labs drawn.  They did test a thyroid peroxidase antibody level which came back elevated at 132.  She is also still dealing with hives.  The last 2 months it is working out about 2 days before her menstrual cycle.  She was getting extensive lab work done with the allergist.  Skin gets extremely itchy she has been taking Allegra for hives and she has been taking Benadryl.  Also using a topical cream.  She says the cream helps for maybe 2 hours.  ROS      Objective:    BP 112/67   Pulse 74   Ht 5\' 2"  (1.575 m)   Wt 130 lb (59 kg)   LMP 09/19/2022 (Exact Date)   SpO2 100%   BMI 23.78 kg/m    Physical Exam Vitals and nursing note reviewed.  Constitutional:      Appearance: She is well-developed.  HENT:     Head: Normocephalic and atraumatic.  Cardiovascular:     Rate and Rhythm: Normal rate and regular rhythm.     Heart sounds: Normal heart sounds.  Pulmonary:     Effort: Pulmonary effort is normal.     Breath sounds: Normal breath sounds.  Skin:    General: Skin is warm and dry.  Neurological:     Mental Status: She is alert and oriented to person, place, and time.  Psychiatric:        Behavior: Behavior normal.     No results found for any visits on 09/20/22.      Assessment & Plan:   Problem List Items Addressed This Visit   None Visit Diagnoses     Thyroid antibody positive    -  Primary   Relevant Orders   TSH + free T4   T3, free   Chronic urticaria          Tdap given today.    There is peroxidase antibody levels were elevated-we discussed getting a TSH, free T4 and free T3 for further workup to make sure  that thyroid function is normal.  If it is normal then I would recommend selenium and may be rechecking the antibody levels in about 3 to 4 months to see if we can reduce those.   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  It can come and go and flare.  She is can try to give the office a call next week as she has not heard back with from them about the results specifically.  No orders of the defined types were placed in this encounter.   No follow-ups on file.  Beatrice Lecher, MD

## 2022-09-20 NOTE — Progress Notes (Signed)
I reviewed the bloodwork.  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  This is a marker of a more autoimmune subset of chronic hives.    Her thyroid function was normal, but she should get this screened annual given anti-TPO status.   All other labs were normal:   Blood count, kidney function, liver function, electrolytes, thyroid, autoimmune screener, inflammation markers, cells), tryptase (checks for mast cell issues) and alpha gal (checks for red meat allergy) were all normal which is great.    She should follow up with me in 3 months as planned.  Can someone let patient know?  Thanks!

## 2022-09-20 NOTE — Progress Notes (Signed)
Pt was seen by an allergist and her TPO was 132 and her TSH was 2.520. she reports that every month around her cycle she notices that she will break out with hives.   Her cycle started yesterday and she stated that she began itching. She started taking Allegra hives. She said that this has helped her some.

## 2022-09-21 LAB — TSH+FREE T4: TSH W/REFLEX TO FT4: 2.01 mIU/L

## 2022-09-21 LAB — T3, FREE: T3, Free: 3 pg/mL (ref 2.3–4.2)

## 2022-09-23 NOTE — Progress Notes (Signed)
Your thyroid function looks great!!!! No worries. I would recommend taking selenium daily. Can be found OTC. Take one a day and we can recheck your antibody levels again in 3 months.

## 2022-10-03 ENCOUNTER — Encounter: Payer: Self-pay | Admitting: Family

## 2022-12-13 ENCOUNTER — Telehealth (INDEPENDENT_AMBULATORY_CARE_PROVIDER_SITE_OTHER): Payer: BC Managed Care – PPO | Admitting: Family Medicine

## 2022-12-13 DIAGNOSIS — F411 Generalized anxiety disorder: Secondary | ICD-10-CM | POA: Diagnosis not present

## 2022-12-13 DIAGNOSIS — Z3009 Encounter for other general counseling and advice on contraception: Secondary | ICD-10-CM

## 2022-12-13 MED ORDER — ESCITALOPRAM OXALATE 10 MG PO TABS
ORAL_TABLET | ORAL | 1 refills | Status: DC
Start: 2022-12-13 — End: 2023-05-19

## 2022-12-13 MED ORDER — NORGESTIM-ETH ESTRAD TRIPHASIC 0.18/0.215/0.25 MG-25 MCG PO TABS: 1.0000 | ORAL_TABLET | Freq: Every day | ORAL | 3 refills | Status: DC

## 2022-12-13 NOTE — Assessment & Plan Note (Addendum)
Discussed options including working with a Environmental consultant.  She already has some strategies to decompress during her day and encouraged her to continue to work at those.  Also will restart Lexapro.  Plan to follow-up in 4 weeks.     12/13/2022    1:42 PM 09/20/2022    3:40 PM 02/20/2022    8:54 AM 12/05/2020    3:07 PM  GAD 7 : Generalized Anxiety Score  Nervous, Anxious, on Edge 3 2 3 1   Control/stop worrying 3 2 3 1   Worry too much - different things 3 2 2 1   Trouble relaxing 2 1 1 1   Restless 2 1 2  0  Easily annoyed or irritable 3 2 2 2   Afraid - awful might happen 2 2 2  0  Total GAD 7 Score 18 12 15 6   Anxiety Difficulty Somewhat difficult Somewhat difficult Not difficult at all Somewhat difficult

## 2022-12-13 NOTE — Progress Notes (Signed)
LVM advising pt that I was calling to do her prescreening.   Pt reports that she has been feeling very anxious for the past 2 months. She states that she has spoken to Dr. Linford Arnold about this previously.

## 2022-12-13 NOTE — Progress Notes (Signed)
Virtual Visit via Video Note  I connected with Jasmine Wyatt on 12/13/22 at  1:40 PM EDT by a video enabled telemedicine application and verified that I am speaking with the correct person using two identifiers.   I discussed the limitations of evaluation and management by telemedicine and the availability of in person appointments. The patient expressed understanding and agreed to proceed.  Patient location: at home Provider location: in office  Subjective:    CC:  No chief complaint on file.   HPI:  LVM advising pt that I was calling to do her prescreening.    Pt reports that she has been feeling very anxious for the past 2 months. She states that she has spoken to Dr. Linford Arnold about this previously   She says she feels like it has been gradually building but it has been getting worse.  She has dealt with depression in the past and was actually on Lexapro several years ago.  She has had episodes of feeling anxious on and off but again not quite to this degree.  She said that she has had some anxiety around being around others.  She said she went into the gym the other night and then literally turned around and walked out because she most felt panicky like she had to leave because there were too many people there.  She has had times where she is driven to the gas station and then feels like she cannot get out of the car because she gets really anxious that something bad is going to happen.  Some nights she does not go to bed or sleep well because it she does not finish or complete her tasks and she feels like she cannot sleep.  She said the other night she stayed up and cleaned her entire kitchen because she just felt like she could not sleep until she got it done.  She has been biting the inside of her cheek.  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  She would also like a refill on her birth control.  Past medical  history, Surgical history, Family history not pertinant except as noted below, Social history, Allergies, and medications have been entered into the medical record, reviewed, and corrections made.    Objective:    General: Speaking clearly in complete sentences without any shortness of breath.  Alert and oriented x3.  Normal judgment. No apparent acute distress.    Impression and Recommendations:    Problem List Items Addressed This Visit       Other   GAD (generalized anxiety disorder) - Primary    Discussed options including working with a therapist or counselor.  She already has some strategies to decompress during her day and encouraged her to continue to work at those.  Also will restart Lexapro.  Plan to follow-up in 4 weeks.     12/13/2022    1:42 PM 09/20/2022    3:40 PM 02/20/2022    8:54 AM 12/05/2020    3:07 PM  GAD 7 : Generalized Anxiety Score  Nervous, Anxious, on Edge 3 2 3 1   Control/stop worrying 3 2 3 1   Worry too much - different things 3 2 2 1   Trouble relaxing 2 1 1 1   Restless 2 1 2  0  Easily annoyed or irritable 3 2 2 2   Afraid - awful might happen 2 2 2  0  Total GAD 7 Score 18 12 15 6   Anxiety Difficulty Somewhat difficult Somewhat difficult Not difficult at all Somewhat difficult         Relevant Medications   escitalopram (LEXAPRO) 10 MG tablet   Encounter for counseling regarding contraception   Relevant Medications   Norgestimate-Ethinyl Estradiol Triphasic 0.18/0.215/0.25 MG-25 MCG tab    No orders of the defined types were placed in this encounter.   Meds ordered this encounter  Medications   escitalopram (LEXAPRO) 10 MG tablet    Sig: Take 0.5 tablets (5 mg total) by mouth daily for 6 days, THEN 1 tablet (10 mg total) daily for 24 days.    Dispense:  30 tablet    Refill:  1   Norgestimate-Ethinyl Estradiol Triphasic 0.18/0.215/0.25 MG-25 MCG tab    Sig: Take 1 tablet by mouth daily.    Dispense:  84 tablet    Refill:  3     I  discussed the assessment and treatment plan with the patient. The patient was provided an opportunity to ask questions and all were answered. The patient agreed with the plan and demonstrated an understanding of the instructions.   The patient was advised to call back or seek an in-person evaluation if the symptoms worsen or if the condition fails to improve as anticipated.   Nani Gasser, MD

## 2023-05-15 ENCOUNTER — Encounter: Payer: Self-pay | Admitting: Family Medicine

## 2023-05-19 ENCOUNTER — Encounter: Payer: Self-pay | Admitting: Family Medicine

## 2023-05-19 ENCOUNTER — Ambulatory Visit (INDEPENDENT_AMBULATORY_CARE_PROVIDER_SITE_OTHER): Payer: BC Managed Care – PPO | Admitting: Family Medicine

## 2023-05-19 VITALS — BP 123/84 | HR 72 | Ht 62.0 in | Wt 135.0 lb

## 2023-05-19 DIAGNOSIS — G4750 Parasomnia, unspecified: Secondary | ICD-10-CM

## 2023-05-19 DIAGNOSIS — G478 Other sleep disorders: Secondary | ICD-10-CM | POA: Diagnosis not present

## 2023-05-19 DIAGNOSIS — R5383 Other fatigue: Secondary | ICD-10-CM | POA: Diagnosis not present

## 2023-05-19 NOTE — Assessment & Plan Note (Signed)
We discussed further workup with a sleep specialist.  I do feel like there is some type of underlying disorder going on she would probably benefit from a sleep study but I would like to get a consultation with a sleep specialist first.  Patient agreed to referral.  She is also not on any sedative type medications or an SSRI.

## 2023-05-19 NOTE — Progress Notes (Signed)
Established Patient Office Visit  Subjective   Patient ID: Jasmine Wyatt, female    DOB: 2000-10-27  Age: 22 y.o. MRN: [id number]  Chief Complaint  Patient presents with   Fatigue   sleep issues    HPI For several years she is actually had significant difficulty with restorative sleep. Sleeps well but then wakes up exhausted.   Can't wake up to multiple alarms and this is caused a significant problem with work Getting 7-10 hours she does sleep.  Denies snoring.  Does report occasional limb jerks.  She said she used to sleepwalk when she was younger but does not think she has done that recently.  Her Epworth sleepiness scale score. He says it takes about 30 minutes for her to feel like she is actually able to motivate.  Sometimes it is 10 to 15 minutes of lying in bed before she feels like she can physically get out of bed.  She does feel like she could doze off fairly easily when she is sitting and reading or if she lies down to rest in the afternoon.  Though she has purposely tried to avoid naps if at all possible because if she does she sleeps too long and then she feels bad.  She says she does not feel particularly stressed or overwhelmed.  Though she did mark a few questions positive on her GAD-7.  Score of 6 out of 7.    Only experienced sleep paralysis once in her lifetime that she remembers.  She does report very vivid dreams.  Talks in her sleep.       ROS    Objective:     BP 123/84   Pulse 72   Ht 5\' 2"  (1.575 m)   Wt 135 lb (61.2 kg)   SpO2 99%   BMI 24.69 kg/m    Physical Exam Vitals reviewed.  Constitutional:      Appearance: Normal appearance.  HENT:     Head: Normocephalic.  Pulmonary:     Effort: Pulmonary effort is normal.  Neurological:     Mental Status: She is alert and oriented to person, place, and time.  Psychiatric:        Mood and Affect: Mood normal.        Behavior: Behavior normal.      No results found for any visits on  05/19/23.    The ASCVD Risk score (Arnett DK, et al., 2019) failed to calculate for the following reasons:   The 2019 ASCVD risk score is only valid for ages 46 to 55    Assessment & Plan:   Problem List Items Addressed This Visit       Nervous and Auditory   Non-restorative sleep    We discussed further workup with a sleep specialist.  I do feel like there is some type of underlying disorder going on she would probably benefit from a sleep study but I would like to get a consultation with a sleep specialist first.  Patient agreed to referral.  She is also not on any sedative type medications or an SSRI.      Relevant Orders   Ambulatory referral to Neurology     Other   Other fatigue - Primary   Relevant Orders   Ambulatory referral to Neurology   Other Visit Diagnoses     Parasomnia, unspecified type       Relevant Orders   Ambulatory referral to Neurology       No follow-ups  on file.    Nani Gasser, MD

## 2023-06-16 ENCOUNTER — Telehealth: Payer: Self-pay | Admitting: Neurology

## 2023-06-16 NOTE — Telephone Encounter (Signed)
Pt called to confirm appointment date and time

## 2023-07-07 ENCOUNTER — Encounter: Payer: Self-pay | Admitting: Family

## 2023-07-12 ENCOUNTER — Encounter: Payer: Self-pay | Admitting: Family

## 2023-07-14 ENCOUNTER — Telehealth: Payer: Self-pay | Admitting: Family Medicine

## 2023-07-14 ENCOUNTER — Encounter: Payer: Self-pay | Admitting: Family Medicine

## 2023-07-14 ENCOUNTER — Ambulatory Visit (INDEPENDENT_AMBULATORY_CARE_PROVIDER_SITE_OTHER): Payer: 59 | Admitting: Family Medicine

## 2023-07-14 VITALS — BP 124/76 | HR 85 | Ht 62.0 in | Wt 140.0 lb

## 2023-07-14 DIAGNOSIS — Z202 Contact with and (suspected) exposure to infections with a predominantly sexual mode of transmission: Secondary | ICD-10-CM | POA: Diagnosis not present

## 2023-07-14 DIAGNOSIS — L708 Other acne: Secondary | ICD-10-CM

## 2023-07-14 NOTE — Patient Instructions (Addendum)
 Dr. Alwyn Pea, MD at [facility] [place] and [place])  Dr. Elsie Lincoln at [facility] [place]

## 2023-07-14 NOTE — Progress Notes (Addendum)
   Acute Office Visit  Subjective:     Patient ID: Jasmine Wyatt, female    DOB: [birth year], 23 y.o.   MRN: [id number]  Chief Complaint  Patient presents with   sti testing    Pt reports that she was told 5 days ago by her partner that he was HSV +.  She stated that they were not using a condom    HPI Patient is in today for possible STI exposure. Had intercourse about 5 days ago and afterwards found out from her partner that he has a history of HSV.  She is currently not having any symptoms but is very concerned.  They did not use condoms.  She plans on estab with a NEW obgyn  Would like a new dermatology referral for her acne.  She is not really interested in doing oral antibiotics.  ROS      Objective:    BP 124/76   Pulse 85   Ht 5' 2 (1.575 m)   Wt 140 lb (63.5 kg)   SpO2 100%   BMI 25.61 kg/m    Physical Exam Vitals reviewed.  Constitutional:      Appearance: Normal appearance.  HENT:     Head: Normocephalic.  Pulmonary:     Effort: Pulmonary effort is normal.  Neurological:     Mental Status: She is alert and oriented to person, place, and time.  Psychiatric:        Mood and Affect: Mood normal.        Behavior: Behavior normal.     No results found for any visits on 07/14/23.      Assessment & Plan:   Problem List Items Addressed This Visit       Musculoskeletal and Integument   Acne   Relevant Orders   Ambulatory referral to Dermatology   Other Visit Diagnoses       Possible exposure to STI    -  Primary   Relevant Orders   HIV antibody (with reflex)   RPR   Hepatitis C Antibody   HSV(herpes simplex vrs) 1+2 ab-IgG   Chlamydia/Gonococcus/Trichomonas, NAA      Will check labs.  Discussed HSV course. Monitor for any lesions.  If at any point she notices any lesions then we can do a culture to confirm.  Did encourage her to get scheduled with a new OB/GYN for routine pelvic exam.   No orders of the defined types were placed in  this encounter.   No follow-ups on file.  Dorothyann Byars, MD

## 2023-07-14 NOTE — Addendum Note (Signed)
 Addended by: Nani Gasser D on: 07/14/2023 04:35 PM   Modules accepted: Orders

## 2023-07-14 NOTE — Telephone Encounter (Signed)
 Sent mychart

## 2023-07-16 LAB — CHLAMYDIA/GONOCOCCUS/TRICHOMONAS, NAA
Chlamydia by NAA: NEGATIVE
Gonococcus by NAA: NEGATIVE
Trich vag by NAA: NEGATIVE

## 2023-07-16 NOTE — Progress Notes (Signed)
 Jasmine Wyatt, you are negative for gonorrhea, chlamydia and trichomonas.

## 2023-07-17 ENCOUNTER — Encounter: Payer: Self-pay | Admitting: Neurology

## 2023-07-17 ENCOUNTER — Ambulatory Visit: Payer: 59 | Admitting: Neurology

## 2023-07-17 VITALS — BP 126/79 | HR 90 | Ht 62.0 in | Wt 141.0 lb

## 2023-07-17 DIAGNOSIS — G4719 Other hypersomnia: Secondary | ICD-10-CM

## 2023-07-17 DIAGNOSIS — G4710 Hypersomnia, unspecified: Secondary | ICD-10-CM

## 2023-07-17 DIAGNOSIS — R400 Somnolence: Secondary | ICD-10-CM

## 2023-07-17 DIAGNOSIS — Z820 Family history of epilepsy and other diseases of the nervous system: Secondary | ICD-10-CM | POA: Diagnosis not present

## 2023-07-17 DIAGNOSIS — R0683 Snoring: Secondary | ICD-10-CM

## 2023-07-17 DIAGNOSIS — G478 Other sleep disorders: Secondary | ICD-10-CM

## 2023-07-17 DIAGNOSIS — F513 Sleepwalking [somnambulism]: Secondary | ICD-10-CM

## 2023-07-17 DIAGNOSIS — R6889 Other general symptoms and signs: Secondary | ICD-10-CM

## 2023-07-17 NOTE — Patient Instructions (Addendum)
 Thank you for choosing [facility] for your sleep related care! It was nice to meet you today! I appreciate that you entrust me with your sleep related healthcare concerns. I hope, I was able to address at least some of your concerns today, and that I can help you feel reassured and also get better.    Here is what we discussed today and what we came up with as our plan for you:    Based on your symptoms and your exam I believe you may have an underlying sleepiness condition. We can look into your sleepiness and tiredness, which you had for years with a nighttime sleep study, followed by a daytime nap study.  Please do not start any new stimulants, or antidepressant or anxiety medications in the interim. Please make sure that you have had appropriate blood tests done including evaluation for vitamin deficiencies and anemia such as vitamin B12 deficiency, iron deficiency, vitamin D deficiency, and thyroid  dysfunction, make sure you are not currently pregnant.  Please do not drive when feeling sleepy.  If the overnight sleep study shows evidence of obstructive sleep apnea (OSA), I may want you to consider treatment with a CPAP or similar machine called AutoPap, as treatment of even borderline or mild sleep apnea can result in improvement of symptoms such as sleep disruption, daytime sleepiness, nighttime bathroom breaks, restless leg symptoms, improvement of headache syndromes, and improved mood disorder.   Please remember, the long-term risks and ramifications of untreated moderate to severe obstructive sleep apnea are: increased Cardiovascular disease, including congestive heart failure, stroke, difficult to control hypertension, treatment resistant obesity, arrhythmias, especially irregular heartbeat commonly known as A. Fib. (atrial fibrillation); even type 2 diabetes has been linked to untreated OSA.   Sleep apnea can cause disruption of sleep and sleep deprivation in most cases,  which, in turn, can cause recurrent headaches, problems with memory, mood, concentration, focus, and vigilance. Most people with untreated sleep apnea report excessive daytime sleepiness, which can affect their ability to drive. Please do not drive if you feel sleepy. Patients with sleep apnea can also develop difficulty initiating and maintaining sleep (aka insomnia).   Having sleep apnea may increase your risk for other sleep disorders, including involuntary behaviors sleep such as sleep terrors, sleep talking, sleepwalking.    Having sleep apnea can also increase your risk for restless leg syndrome and leg movements at night.   Please note that untreated obstructive sleep apnea may carry additional perioperative morbidity. Patients with significant obstructive sleep apnea (typically, in the moderate to severe degree) should receive, if possible, perioperative PAP (positive airway pressure) therapy and the surgeons and particularly the anesthesiologists should be informed of the diagnosis and the severity of the sleep disordered breathing.   Please do not drive when feeling sleepy.   I will plan to see you back after your sleep study to go over the test results and where to go from there. We will call you after your sleep study to advise about the results (most likely, you will hear from my nurse) and to set up an appointment at the time, as necessary.

## 2023-07-17 NOTE — Progress Notes (Signed)
 Subjective:    Patient ID: Jasmine Wyatt is a 23 y.o. female.  HPI    True Mar, MD, PhD [facility] [address] [address] [place], [place] [address]  Dear Dr. Alvan,   I saw your patient, JOHNIYA DURFEE, upon your kind request in my sleep clinic today for initial consultation of her sleep disorder, in particular, concern for underlying obstructive sleep apnea.  The patient is unaccompanied today.  As you know, Ms. Jhaveri is a 23 year old female with an underlying benign medical history with the exception of borderline overweight state, who reports a longer standing history of daytime somnolence, nonrestorative sleep and vivid dreams.  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  Her Epworth sleepiness score is 15 out of 24, fatigue severity score is 56 out of 63.  She is currently working as a psychologist, forensic at [employer].  She has trouble waking up in the morning, she sleeps deeply and requires multiple alarms or someone to physically wake up.  Sometimes he sleeps in the same bed with her mom.  She has a half sister on dad side.  No family history of narcolepsy but dad has sleep apnea and uses a PAP machine.  She endorses a history of sleep talking and sleepwalking particularly as a child.  She denies any telltale symptoms of cataplexy and hypnagogic or hypnopompic hallucinations.  She has very vivid dreams, she has had 1 episode of sleep paralysis that she recalls.  She can have dreams and her naps.  She avoids napping.  She has avoided caffeine since August 2024.  She used to drink energy drinks while in college.  Current bedtime is between 8:30 PM and 9 and rise time is around 7 AM.  She denies nightly nocturia or recurrent morning headaches or nocturnal headaches.  She currently does not drink caffeine daily.  She is a non-smoker and does not utilize any illicit drugs, does not smoke  any marijuana.   She does not wake up rested.  She feels sleepy at the wheel at times but has to roll over, never dozed off at the wheel.  she lives with her mom and her younger brother.  They have 1 rabbit in the household.  I reviewed your office visit note from 05/19/2023 and from 07/14/2023.  Upon chart review, she had blood tests for STI recently, reports that she does not currently have a significant other.  She did not recently have a pregnancy test.  She did have a pregnancy termination in early 2024.  She has had blood tests for a concern for fatigue and tiredness including a thyroid  check.  Her Past Medical History Is Significant For: History reviewed. No pertinent past medical history.  Her Past Surgical History Is Significant For: Past Surgical History:  Procedure Laterality Date   NO PAST SURGERIES     ROOT CANAL  10/2015    Her Family History Is Significant For: Family History  Problem Relation Age of Onset   Hypertension Father    Sleep apnea Father    Food Allergy  Brother        peanut, shellfish, fish   Allergic rhinitis Brother    Cancer Maternal Grandmother        lung   Hypertension Maternal Grandmother    Asthma Cousin    Eczema Cousin    Angioedema Neg Hx    Atopy Neg Hx  Immunodeficiency Neg Hx    Urticaria Neg Hx     Her Social History Is Significant For: Social History   Socioeconomic History   Marital status: Single    Spouse name: Not on file   Number of children: 0   Years of education: Not on file   Highest education level: Bachelor's degree (e.g., BA, AB, BS)  Occupational History   Occupation: student  Tobacco Use   Smoking status: Never    Passive exposure: Never   Smokeless tobacco: Never  Vaping Use   Vaping status: Never Used  Substance and Sexual Activity   Alcohol use: Yes    Comment: socially   Drug use: Not Currently   Sexual activity: Yes    Birth control/protection: Condom  Other Topics Concern   Not on file  Social  History Narrative   She currently lives with her mother starlet and her brother YASMIN. Her parents are separated. Her mother is a social research officer, government and her father is a actuary. She runs track in the fall in the spring and plans on being a fashion designer      Caffeine: less now, was 2 cups/day   Social Drivers of Health   Financial Resource Strain: Patient Declined (07/12/2023)   Overall Financial Resource Strain (CARDIA)    Difficulty of Paying Living Expenses: Patient declined  Food Insecurity: No Food Insecurity (07/12/2023)   Hunger Vital Sign    Worried About Running Out of Food in the Last Year: Never true    Ran Out of Food in the Last Year: Never true  Transportation Needs: No Transportation Needs (07/12/2023)   PRAPARE - Administrator, Civil Service (Medical): No    Lack of Transportation (Non-Medical): No  Physical Activity: Sufficiently Active (07/12/2023)   Exercise Vital Sign    Days of Exercise per Week: 6 days    Minutes of Exercise per Session: 40 min  Stress: Stress Concern Present (07/12/2023)   [employer] of Occupational Health - Occupational Stress Questionnaire    Feeling of Stress : Rather much  Social Connections: Moderately Integrated (07/12/2023)   Social Connection and Isolation Panel [NHANES]    Frequency of Communication with Friends and Family: More than three times a week    Frequency of Social Gatherings with Friends and Family: Three times a week    Attends Religious Services: More than 4 times per year    Active Member of Clubs or Organizations: Yes    Attends Banker Meetings: More than 4 times per year    Marital Status: Never married    Her Allergies Are:  No Known Allergies:   Her Current Medications Are:  No outpatient encounter medications on file as of 07/17/2023.   No facility-administered encounter medications on file as of 07/17/2023.  :   Review of Systems:  Out of a complete 14 point review of systems, all are  reviewed and negative with the exception of these symptoms as listed below:  Review of Systems  Neurological:        Patient is here alone for sleep consult. She experiences fatigue, trouble feeling rested, and sometimes trouble with sleeping. She has been told she snores sometimes. She has never had a sleep study before. Her father uses a cpap machine. ESS ESS 15     Objective:  Neurological Exam  Physical Exam Physical Examination:   Vitals:   07/17/23 1518  BP: 126/79  Pulse: 90    General Examination:  The patient is a very pleasant 23 y.o. female in no acute distress. She appears well-developed and well-nourished and well groomed.   HEENT: Normocephalic, atraumatic, pupils are equal, round and reactive to light, extraocular tracking is good without limitation to gaze excursion or nystagmus noted. Hearing is grossly intact. Face is symmetric with normal facial animation. Speech is clear with no dysarthria noted. There is no hypophonia. There is no lip, neck/head, jaw or voice tremor. Neck is supple with full range of passive and active motion. There are no carotid bruits on auscultation. Oropharynx exam reveals: No significant mouth dryness, good dental hygiene, mild airway crowding secondary to small airway entry and tonsillar size of about 2+ bilaterally.  Tongue protrudes centrally and palate elevates symmetrically, Mallampati class I, neck circumference 14-5/8 inches, minimal overbite noted.   Chest: Clear to auscultation without wheezing, rhonchi or crackles noted.  Heart: S1+S2+0, regular and normal without murmurs, rubs or gallops noted.   Abdomen: Soft, non-tender and non-distended.  Extremities: There is no pitting edema in the distal lower extremities bilaterally.   Skin: Warm and dry without trophic changes noted.   Musculoskeletal: exam reveals no obvious joint deformities.   Neurologically:  Mental status: The patient is awake, alert and oriented in all 4 spheres.  Her immediate and remote memory, attention, language skills and fund of knowledge are appropriate. There is no evidence of aphasia, agnosia, apraxia or anomia. Speech is clear with normal prosody and enunciation. Thought process is linear. Mood is normal and affect is normal.  Cranial nerves II - XII are as described above under HEENT exam.  Motor exam: Normal bulk, strength and tone is noted. There is no obvious action or resting tremor.  Fine motor skills and coordination: grossly intact.  Cerebellar testing: No dysmetria or intention tremor. There is no truncal or gait ataxia.  Sensory exam: intact to light touch in the upper and lower extremities.  Gait, station and balance: She stands easily. No veering to one side is noted. No leaning to one side is noted. Posture is age-appropriate and stance is narrow based. Gait shows normal stride length and normal pace. No problems turning are noted.   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  Differential diagnosis primarily includes narcolepsy without cataplexy versus idiopathic hypersomnolence.  Underlying sleep disordered breathing is not completely excluded.  She is advised to proceed with extended sleep testing in the form of a nocturnal polysomnogram with next day nap testing called MSLT.  I explained to her these test procedures and how they are conducted in the sleep lab.  We talked about potentially utilizing symptomatic treatment options depending on the test results.  If she has sleep disordered breathing such as obstructive sleep apnea we may utilize AutoPap therapy after testing.  She does endorse a family history of sleep apnea.  She is reminded not to drive when feeling sleepy and pull over to rest.  She is advised not to start any new  antidepressant or neurotropic her psychotropic medications in order to proceed with testing without delay.  I explained the difference between narcolepsy and idiopathic hypersomnolence to her.  This was an extended visit of over 60 minutes with considerable counseling and coordination of care involved. For medical workup for her fatigue, I do recommend making  sure she is not anemic, she has no thyroid  dysfunction, that she is not pregnant, that she has no vitamin deficiencies particularly B12 and vitamin D deficiencies.  Please make sure that she has appropriate blood tests for these.  She is advised that we will call her to schedule her sleep testing.  We will plan to follow-up after testing and make discuss medication options for her depending on the test results. I answered all her questions today and she was in agreement with our approach.  Thank you very much for allowing me to participate in the care of this nice patient. If I can be of any further assistance to you please do not hesitate to call me at [phone].  Sincerely,   True Mar, MD, PhD

## 2023-07-22 IMAGING — DX DG TOE GREAT 2+V*R*
3 series · 3 of 3 positions shown · non-contrast
Comparison: 08/20/2021

CLINICAL DATA: Toe fracture

EXAM:
RIGHT GREAT TOE

[toe ap]
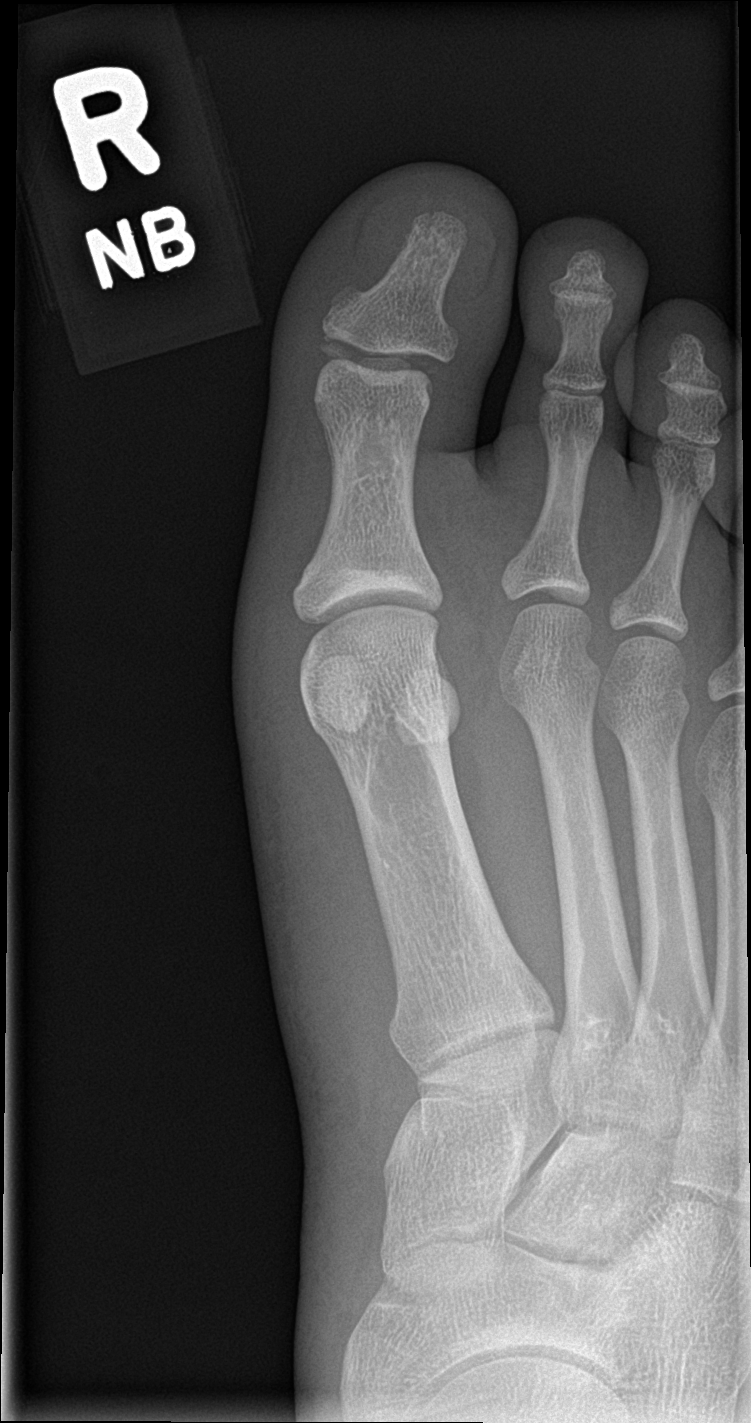

[toe obl]
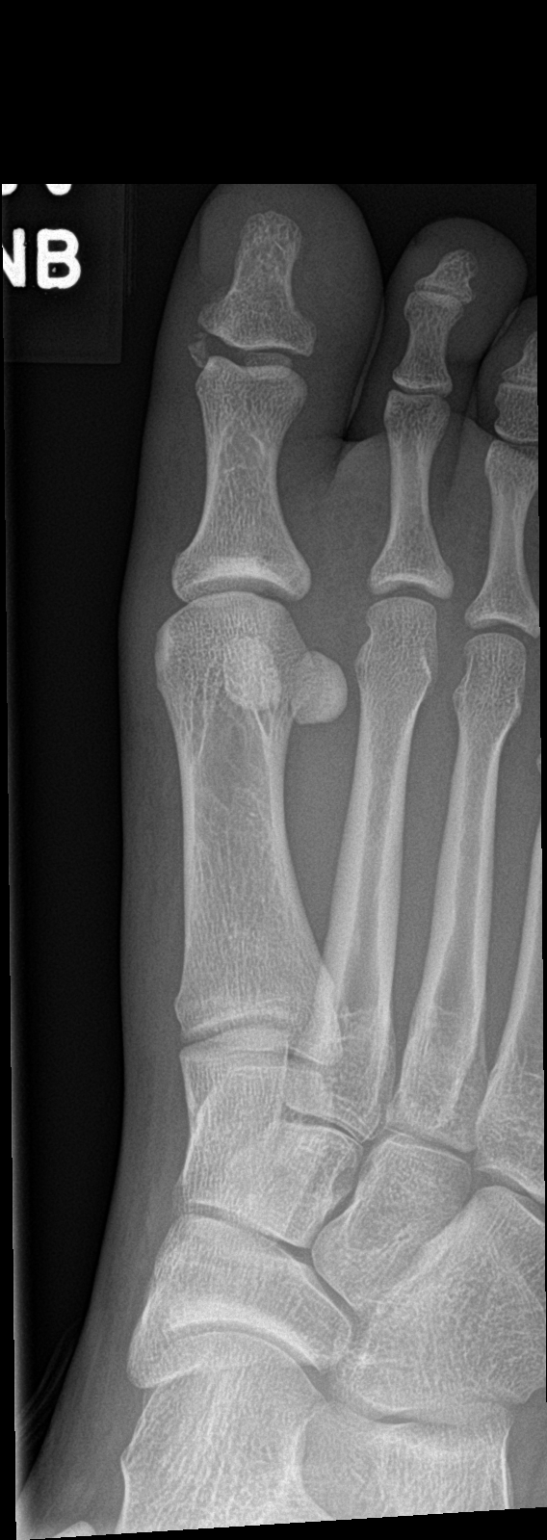

[toe lat]
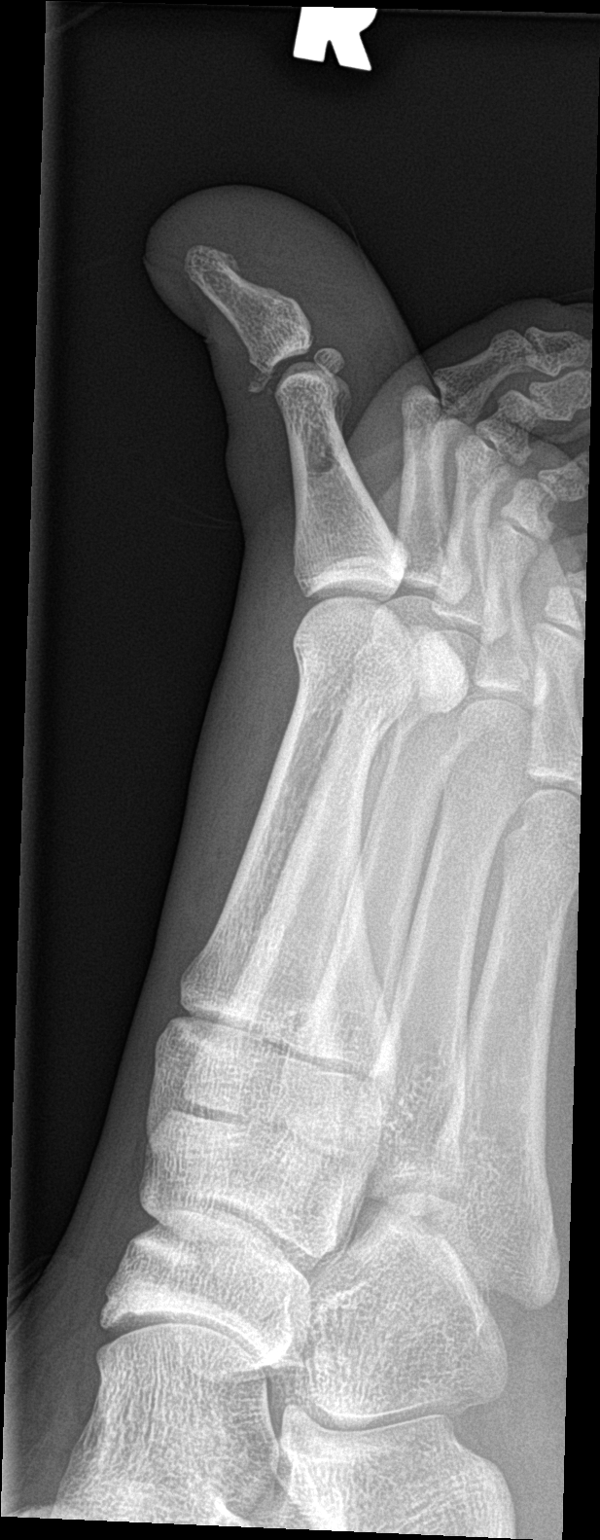

[3 of 3 positions shown; findings below may reference images not displayed]

FINDINGS: Small displaced fracture fragments at the dorsal base of the first
distal phalanx without change in alignment. No apparent bridging
callus. No subluxation.
IMPRESSION: No change in alignment of the small fracture fragments at the dorsal
base of the distal phalanx.

## 2023-08-06 ENCOUNTER — Telehealth: Payer: Self-pay | Admitting: Neurology

## 2023-08-06 NOTE — Telephone Encounter (Signed)
LVM for pt to call back to schedule   NPSG/MSLT [employer] state no auth req spoke to [place] ref # [id number]

## 2023-08-14 NOTE — Telephone Encounter (Signed)
 LVM for pt to call back to schedle

## 2023-09-01 NOTE — Telephone Encounter (Signed)
 Patient left voicemail on my phone wanting to schedule her SS.  I called her back but she did not pick up I left her a voicemail to call me with my direct number.

## 2023-09-04 NOTE — Telephone Encounter (Signed)
 Patient returned my call.  NPSG/MSLT [employer] state no auth req EE   She is scheduled at [facility] for 11/09/2023 at 8 pm and all day on Monday 11/10/2023.  Mailed packet to the patient.

## 2023-11-03 ENCOUNTER — Encounter: Payer: Self-pay | Admitting: Family Medicine

## 2023-11-03 ENCOUNTER — Telehealth (INDEPENDENT_AMBULATORY_CARE_PROVIDER_SITE_OTHER): Admitting: Family Medicine

## 2023-11-03 DIAGNOSIS — G478 Other sleep disorders: Secondary | ICD-10-CM | POA: Diagnosis not present

## 2023-11-03 DIAGNOSIS — F411 Generalized anxiety disorder: Secondary | ICD-10-CM | POA: Diagnosis not present

## 2023-11-03 MED ORDER — ESCITALOPRAM OXALATE 10 MG PO TABS
ORAL_TABLET | ORAL | 1 refills | Status: DC
Start: 2023-11-03 — End: 2023-12-15

## 2023-11-03 NOTE — Progress Notes (Signed)
    Virtual Visit via Video Note  I connected with Irean Manner on 11/03/23 at  8:10 AM EDT by a video enabled telemedicine application and verified that I am speaking with the correct person using two identifiers.   I discussed the limitations of evaluation and management by telemedicine and the availability of in person appointments. The patient expressed understanding and agreed to proceed.  Patient location: at home Provider location: in office  Subjective:    CC:   Chief Complaint  Patient presents with   mood    HPI: She is here today to follow-up for anxiety.  He still really struggling with that she says ever since she quit running track several years ago she just has a lot of self-doubt she questions conversations that she has and it just creates a lot of anxiety for her.  We had also referred her to neurology for excessive daytime sleepiness and somnolence sleep paralysis.  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   Past medical history, Surgical history, Family history not pertinant except as noted below, Social history, Allergies, and medications have been entered into the medical record, reviewed, and corrections made.    Objective:    General: Speaking clearly in complete sentences without any shortness of breath.  Alert and oriented x3.  Normal judgment. No apparent acute distress.    Impression and Recommendations:    Problem List Items Addressed This Visit       Nervous and Auditory   Non-restorative sleep    should get sleep study results back soon and that will be helpful.        Other   GAD (generalized anxiety disorder) -  Primary   Lexapro  call if any problems or concerns.  If she is otherwise doing well follow-up in about 8 weeks.  Courage her to consider therapy/counseling.  She has been employing some self techniques including deep breathing, using a squeezy ball, doing jumping jacks when she feels really anxious and she has started exercising again which she feels like has been really helpful.      Relevant Medications   escitalopram  (LEXAPRO ) 10 MG tablet    No orders of the defined types were placed in this encounter.   Meds ordered this encounter  Medications   escitalopram  (LEXAPRO ) 10 MG tablet    Sig: Take 0.5 tablets (5 mg total) by mouth daily for 14 days, THEN 1 tablet (10 mg total) daily for 14 days.    Dispense:  30 tablet    Refill:  1     I discussed the assessment and treatment plan with the patient. The patient was provided an opportunity to ask questions and all were answered. The patient agreed with the plan and demonstrated an understanding of the instructions.   The patient was advised to call back or seek an in-person evaluation if the symptoms worsen or if the condition fails to improve as anticipated.   Duaine German, MD

## 2023-11-03 NOTE — Assessment & Plan Note (Signed)
 should get sleep study results back soon and that will be helpful.

## 2023-11-03 NOTE — Assessment & Plan Note (Signed)
 Lexapro  call if any problems or concerns.  If she is otherwise doing well follow-up in about 8 weeks.  Courage her to consider therapy/counseling.  She has been employing some self techniques including deep breathing, using a squeezy ball, doing jumping jacks when she feels really anxious and she has started exercising again which she feels like has been really helpful.

## 2023-11-09 ENCOUNTER — Ambulatory Visit (INDEPENDENT_AMBULATORY_CARE_PROVIDER_SITE_OTHER): Payer: 59 | Admitting: Neurology

## 2023-11-09 DIAGNOSIS — R0683 Snoring: Secondary | ICD-10-CM

## 2023-11-09 DIAGNOSIS — G4711 Idiopathic hypersomnia with long sleep time: Secondary | ICD-10-CM | POA: Diagnosis not present

## 2023-11-09 DIAGNOSIS — R400 Somnolence: Secondary | ICD-10-CM

## 2023-11-09 DIAGNOSIS — G478 Other sleep disorders: Secondary | ICD-10-CM

## 2023-11-09 DIAGNOSIS — Z820 Family history of epilepsy and other diseases of the nervous system: Secondary | ICD-10-CM

## 2023-11-09 DIAGNOSIS — R6889 Other general symptoms and signs: Secondary | ICD-10-CM

## 2023-11-09 DIAGNOSIS — G4719 Other hypersomnia: Secondary | ICD-10-CM

## 2023-11-09 DIAGNOSIS — G4720 Circadian rhythm sleep disorder, unspecified type: Secondary | ICD-10-CM

## 2023-11-09 DIAGNOSIS — F513 Sleepwalking [somnambulism]: Secondary | ICD-10-CM

## 2023-11-09 DIAGNOSIS — G4710 Hypersomnia, unspecified: Secondary | ICD-10-CM

## 2023-11-10 ENCOUNTER — Other Ambulatory Visit: Payer: Self-pay | Admitting: *Deleted

## 2023-11-10 ENCOUNTER — Other Ambulatory Visit: Payer: Self-pay

## 2023-11-10 ENCOUNTER — Ambulatory Visit: Payer: 59 | Admitting: Neurology

## 2023-11-10 DIAGNOSIS — Z820 Family history of epilepsy and other diseases of the nervous system: Secondary | ICD-10-CM

## 2023-11-10 DIAGNOSIS — Z79899 Other long term (current) drug therapy: Secondary | ICD-10-CM

## 2023-11-10 DIAGNOSIS — R400 Somnolence: Secondary | ICD-10-CM

## 2023-11-10 DIAGNOSIS — G4719 Other hypersomnia: Secondary | ICD-10-CM

## 2023-11-10 DIAGNOSIS — R0683 Snoring: Secondary | ICD-10-CM

## 2023-11-10 DIAGNOSIS — G4710 Hypersomnia, unspecified: Secondary | ICD-10-CM

## 2023-11-10 DIAGNOSIS — F513 Sleepwalking [somnambulism]: Secondary | ICD-10-CM

## 2023-11-10 DIAGNOSIS — R6889 Other general symptoms and signs: Secondary | ICD-10-CM

## 2023-11-10 DIAGNOSIS — G4711 Idiopathic hypersomnia with long sleep time: Secondary | ICD-10-CM | POA: Diagnosis not present

## 2023-11-10 DIAGNOSIS — G478 Other sleep disorders: Secondary | ICD-10-CM

## 2023-11-13 LAB — COMPREHENSIVE DRUG ANALYSIS,UR

## 2023-11-19 ENCOUNTER — Ambulatory Visit: Payer: Self-pay | Admitting: Neurology

## 2023-11-19 NOTE — Procedures (Signed)
 Physician Interpretation:     [facility] POLYSOMNOGRAPHY  INTERPRETATION REPORT   STUDY DATE:  11/09/2023     PATIENT NAME:  Jasmine Wyatt         DATE OF BIRTH:  [birth year]  PATIENT ID:  [id number]    TYPE OF STUDY:  PSG  READING PHYSICIAN: Debbra Fairy, MD, PhD   SCORING TECHNICIAN: Octavia Belton, RPSGT   Referred by: Cydney Draft, MD  ? History and Indication for Testing: 23 year old female with an underlying benign medical history with the exception of borderline overweight state, who reports a longer standing history of daytime somnolence, nonrestorative sleep and vivid dreams. 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 Her Epworth sleepiness score is 15 out of 24, fatigue severity score is 56 out of 63.  Her UDS on 11/10/23 was negative for any neurotropic or psychotropic drugs, positive for acetaminophen .  Height: 62 in Weight: 141 lb (BMI 25) Neck Size: 15 in    MEDICATIONS: None listed   TECHNICAL DESCRIPTION: A registered sleep technologist was in attendance for the duration of the recording.  Data collection, scoring, video monitoring, and reporting were performed in compliance with the AASM Manual for the Scoring of Sleep and Associated Events; (Hypopnea is scored based on the criteria listed in Section VIII D. 1b in the AASM Manual V2.6 using a 4% oxygen desaturation rule or Hypopnea is scored based on the criteria listed in Section VIII D. 1a in the AASM Manual V2.6 using 3% oxygen desaturation and /or arousal rule).   SLEEP CONTINUITY AND SLEEP ARCHITECTURE:  Lights-out was at 22:14: and lights-on at  06:40:, with a total recording time of 8 hours, 25 min. Total sleep time ( TST) was 437.5 minutes with a normal sleep efficiency at 86.6%. There was  17.9% REM sleep.  BODY POSITION:  TST was divided  between the following sleep positions: 56.1% supine;  43.9% lateral;  0%  prone. Duration of total sleep and percent of total sleep in their respective position is as follows: supine 245 minutes (56%), non-supine 192 minutes (44%); right 43 minutes (10%), left 149 minutes (34%), and prone 00 minutes (0%).  Total supine REM sleep time was 70 minutes (90% of total REM sleep).  Sleep latency was increased at 40.0 minutes.  REM sleep latency was increased at 137.0 minutes. Of the total sleep time, the percentage of stage N1 sleep was 5.1%, stage N2 sleep was 67%, which is mildly increased, stage N3 sleep was 9.5%, which is reduced, and REM sleep was 17.9%, which is mildly reduced. Wake after sleep onset (WASO) time accounted for 27.5 minutes with minimal to mild sleep fragmentation noted.   RESPIRATORY MONITORING:   Based on CMS criteria (using a 4% oxygen desaturation rule for scoring hypopneas), there were 8 apneas (1 obstructive; 6 central; 1 mixed), and 12 hypopneas.  Apnea index was 1.1. Hypopnea index was 1.6. The apnea-hypopnea index was 2.7 overall (2.7 supine, 0 non-supine; 2.3 REM, 2.6 supine REM).  There were 0 respiratory effort-related arousals (RERAs).  The RERA index was 0 events/h. Total respiratory disturbance index (RDI) was 2.7 events/h. RDI results showed: supine RDI  2.7 /h; non-supine RDI 2.8 /h; REM RDI 2.3 /h, supine REM RDI 2.6 /h.   Based on AASM criteria (using a 3% oxygen desaturation and /or arousal rule for scoring hypopneas), there were 8 apneas (1 obstructive; 6 central; 1 mixed), and 21 hypopneas.  Apnea index was 1.1. Hypopnea index was 2.9. The apnea-hypopnea index was 4.0/hour overall (3.7 supine, 0 non-supine; 4.6 REM, 5.1 supine REM).  There were 0 respiratory effort-related arousals (RERAs).  The RERA index was 0 events/h. Total respiratory disturbance index (RDI) was 4.0 events/h. RDI results showed: supine RDI  3.7 /h; non-supine RDI 4.4 /h; REM RDI 4.6 /h, supine REM RDI 5.1 /h.  OXIMETRY: Oxyhemoglobin Saturation Nadir during sleep was at   77% (briefly) from a mean of 97%.  Of the Total sleep time (TST)   hypoxemia (=<88%) was present for  3.0 minutes (which is likely overestimated, due to errors in the O2 sensor).  LIMB MOVEMENTS: There were 0 periodic limb movements of sleep (0.0/hr), of which 0 (0.0/hr) were associated with an arousal.   AROUSAL: There were 82 arousals in total, for an arousal index of 11 arousals/hour.  Of these, 24 were identified as respiratory-related arousals (3 /h), 0 were PLM-related arousals (0 /h), and 63 were non-specific arousals (9 /h).   EEG:  Review of the EEG showed no abnormal electrical discharges and symmetrical bihemispheric findings.      EKG: The EKG revealed normal sinus rhythm (NSR). The average heart rate during sleep was 73 bpm.    AUDIO/VIDEO REVIEW: The audio and video review did not show any abnormal or unusual behaviors, movements, phonations or vocalizations. The patient took 1 restroom break. Snoring was noted, intermittently in the mild range.    POST-STUDY QUESTIONNAIRE: Post study, the patient indicated, that sleep was better than usual.    MSLT: The patient had a nap study, MSLT, next day on 11/10/23 with a mean sleep latency of 7.9 minutes for 5 naps and 0 SOREMPs (sleep onset REM periods) noted. 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 Her UDS on 11/10/23 was negative for any neurotropic or psychotropic drugs, positive for acetaminophen .   IMPRESSION:  1. Idiopathic hypersomnolence 2. Primary Snoring 3. Dysfunctions associated with sleep stages or arousal from sleep   RECOMMENDATIONS:  1. The overnight sleep study and next day nap study indicate no significantly sleep disordered breathing, except for mild intermittent snoring. The low mean sleep latency (7.9 min) and 0 SOREMPs (sleep onset REM periods) for the nap study indicate significant sleepiness, but findings do not fulfill criteria for narcolepsy. The  findings, along with the patient's clinical history support the diagnosis of idiopathic hypersomnolence. Treatment options should be discussed with the patient.   2. The patient should be cautioned not to drive, work at heights, or operate dangerous or heavy equipment when tired or sleepy. Review and reiteration of good sleep hygiene measures should be pursued with any patient. 3. The night time sleep study shows some sleep fragmentation and abnormal sleep stage percentages; these are nonspecific findings and per se do not signify an intrinsic sleep disorder or a cause for the patient's sleep-related symptoms. Causes include (but are not limited to) the first night effect of the sleep study, circadian rhythm disturbances, medication effect or an underlying mood disorder or medical problem.  4. The patient will be seen in follow-up in the sleep clinic at [facility] for discussion of the test results, symptom and treatment compliance review, further management strategies, etc. The referring provider will be notified of the test results. I certify that I have reviewed the entire raw data recording prior to the issuance of this report in accordance with the Standards of Accreditation of the American Academy of Sleep Medicine (AASM).  Debbra Fairy, MD, PhD Medical Director, [facility]) Diplomat, ABPN (Neurology and Sleep)               NPSG Technical Report:   General Information  Name: Yitty, Dacres BMI: 25.79 Physician: Debbra Fairy, MD  ID: [id number] Height: 62.0 in Technician: Octavia Belton, RPSGT  Sex: Female Weight: 141.0 lb Record: [id number]  Age: 53 [11/02/2000] Date: 11/09/2023    Medical & Medication History    Ms. Bruegger is a 23 year old female with an underlying benign medical history with the exception of borderline overweight state, who reports a longer standing history of daytime somnolence, nonrestorative sleep and vivid dreams. 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 Her Epworth sleepiness score is 15 out of 24, fatigue severity score is 56 out of 63. She is currently working as a Psychologist, forensic at [employer]. She has trouble waking up in the morning, she sleeps deeply and requires multiple alarms or someone to physically wake up. Sometimes he sleeps in the same bed with her mom. She has a half sister on dad side. No family history of narcolepsy but dad has sleep apnea and uses a PAP machine. She endorses a history of sleep talking and sleepwalking particularly as a child. She denies any telltale symptoms of cataplexy and hypnagogic or hypnopompic hallucinations. She has very vivid dreams, she has had 1 episode of sleep paralysis that she recalls. She can have dreams and her naps. She avoids napping. She has avoided caffeine since August 2024. She used to drink energy drinks while in college. Current bedtime is between 8:30 PM and 9 and rise time is around 7 AM. She denies nightly nocturia or recurrent morning headaches or nocturnal headaches. She currently does not drink caffeine daily. She is a non-smoker and does not utilize any illicit drugs, does not smoke any marijuana. She does not wake up rested. She feels sleepy at the wheel at times but has to roll over, never dozed off at the wheel. she lives with her mom and her younger brother. They have 1 rabbit in the household.  None listed   Sleep Disorder      Comments   Patient arrived for a diagnostic polysomnogram, to be followed by an MSLT if warranted. Procedure explained and all questions answered. Standard paste setup without complications. Patient slept supine, left, and right. Mild snoring was noted. Respiratory events observed, worse while supine. No significant cardiac arrhythmias noted. No significant PLMS observed. One restroom visit.    Lights out: 10:14:48 PM Lights on: 06:40:02 AM   Time Total  Supine Side Prone Upright  Recording (TRT) 8h 25.41m 4h 56.51m 3h 29.30m 0h 0.41m 0h 0.43m  Sleep (TST) 7h 17.66m 4h 5.40m 3h 12.73m 0h 0.69m 0h 0.28m   Latency N1 N2 N3 REM Onset Per. Slp. Eff.  Actual 0h 0.47m 0h 3.79m 0h 51.57m 2h 17.41m 0h 40.77m 1h 16.27m 86.63%   Stg Dur Wake N1 N2 N3 REM  Total 67.5 22.5 295.0 41.5 78.5  Supine 50.5 10.0 155.0 10.0 70.5  Side 17.0 12.5 140.0 31.5 8.0  Prone 0.0 0.0 0.0 0.0 0.0  Upright 0.0 0.0 0.0 0.0 0.0   Stg % Wake N1 N2 N3 REM  Total 13.4 5.1 67.4 9.5 17.9  Supine 10.0 2.3 35.4 2.3 16.1  Side 3.4 2.9 32.0 7.2 1.8  Prone 0.0 0.0 0.0 0.0 0.0  Upright 0.0 0.0 0.0  0.0 0.0     Apnea Summary Sub Supine Side Prone Upright  Total 8 Total 8 6 2  0 0    REM 1 1 0 0 0    NREM 7 5 2  0 0  Obs 1 REM 0 0 0 0 0    NREM 1 0 1 0 0  Mix 1 REM 0 0 0 0 0    NREM 1 1 0 0 0  Cen 6 REM 1 1 0 0 0    NREM 5 4 1  0 0   Rera Summary Sub Supine Side Prone Upright  Total 0 Total 0 0 0 0 0    REM 0 0 0 0 0    NREM 0 0 0 0 0   Hypopnea Summary Sub Supine Side Prone Upright  Total 21 Total 21 9 12  0 0    REM 5 5 0 0 0    NREM 16 4 12  0 0   4% Hypopnea Summary Sub Supine Side Prone Upright  Total (4%) 12 Total 12 5 7  0 0    REM 2 2 0 0 0    NREM 10 3 7  0 0     AHI Total Obs Mix Cen  3.98 Apnea 1.10 0.14 0.14 0.82   Hypopnea 2.88 -- -- --  2.74 Hypopnea (4%) 1.65 -- -- --    Total Supine Side Prone Upright  Position AHI 3.98 3.67 4.38 0.00 0.00  REM AHI 4.59   NREM AHI 3.84   Position RDI 3.98 3.67 4.38 0.00 0.00  REM RDI 4.59   NREM RDI 3.84    4% Hypopnea Total Supine Side Prone Upright  Position AHI (4%) 2.74 2.69 2.81 0.00 0.00  REM AHI (4%) 2.29   NREM AHI (4%) 2.84   Position RDI (4%) 2.74 2.69 2.81 0.00 0.00  REM RDI (4%) 2.29   NREM RDI (4%) 2.84    Desaturation Information Threshold: 2% <100% <90% <80% <70% <60% <50% <40%  Supine 116.0 6.0 0.0 0.0 0.0 0.0 0.0  Side 71.0 4.0 1.0 0.0 0.0 0.0 0.0  Prone 0.0 0.0 0.0 0.0 0.0 0.0 0.0  Upright 0.0  0.0 0.0 0.0 0.0 0.0 0.0  Total 187.0 10.0 1.0 0.0 0.0 0.0 0.0  Index 24.1 1.3 0.1 0.0 0.0 0.0 0.0   Threshold: 3% <100% <90% <80% <70% <60% <50% <40%  Supine 52.0 6.0 0.0 0.0 0.0 0.0 0.0  Side 38.0 4.0 1.0 0.0 0.0 0.0 0.0  Prone 0.0 0.0 0.0 0.0 0.0 0.0 0.0  Upright 0.0 0.0 0.0 0.0 0.0 0.0 0.0  Total 90.0 10.0 1.0 0.0 0.0 0.0 0.0  Index 11.6 1.3 0.1 0.0 0.0 0.0 0.0   Threshold: 4% <100% <90% <80% <70% <60% <50% <40%  Supine 36.0 6.0 0.0 0.0 0.0 0.0 0.0  Side 27.0 4.0 1.0 0.0 0.0 0.0 0.0  Prone 0.0 0.0 0.0 0.0 0.0 0.0 0.0  Upright 0.0 0.0 0.0 0.0 0.0 0.0 0.0  Total 63.0 10.0 1.0 0.0 0.0 0.0 0.0  Index 8.1 1.3 0.1 0.0 0.0 0.0 0.0   Threshold: 4% <100% <90% <80% <70% <60% <50% <40%  Supine 36 6 0 0 0 0 0  Side 27 4 1  0 0 0 0  Prone 0 0 0 0 0 0 0  Upright 0 0 0 0 0 0 0  Total 63 10 1 0 0 0 0   Awakening/Arousal Information # of Awakenings 18  Wake after sleep onset 27.42m  Wake  after persistent sleep 18.73m   Arousal Assoc. Arousals Index  Apneas 6 0.8  Hypopneas 18 2.5  Leg Movements 9 1.2  Snore 0 0.0  PTT Arousals 0 0.0  Spontaneous 63 8.6  Total 96 13.2  Leg Movement Information PLMS LMs Index  Total LMs during PLMS 0 0.0  LMs w/ Microarousals 0 0.0   LM LMs Index  w/ Microarousal 9 1.2  w/ Awakening 2 0.3  w/ Resp Event 0 0.0  Spontaneous 7 1.0  Total 16 2.2     Desaturation threshold setting: 4% Minimum desaturation setting: 10 seconds SaO2 nadir: 56% The longest event was a 21 sec obstructive Hypopnea with a minimum SaO2 of 77%. The lowest SaO2 was 77% associated with a 21 sec obstructive Hypopnea. EKG Rates EKG Avg Max Min  Awake 77 131 55  Asleep 73 105 55  EKG Events: Tachycardia  MSLT Physician Report:   [facility] MSLT Report    Name: Yanelie, Polin MRN: [id number]  Study Date: 11/10/2023 Procedure #: 0 DOB: 2001/02/25  Referred by: Cydney Draft, MD  ? History and Indication for Testing:  23 year old female with an underlying benign medical history with the exception of borderline overweight state, who reports a longer standing history of daytime somnolence, nonrestorative sleep and vivid dreams. 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 Her Epworth sleepiness score is 15 out of 24, fatigue severity score is 56 out of 63.  Her UDS on 11/10/23 was negative for any neurotropic or psychotropic drugs, positive for acetaminophen .  Height: 62 in Weight: 141 lb (BMI 25) Neck Size: 15 in    MEDICATIONS: None listed Protocol This is a 13 channel Multiple Sleep Latency Test comprised of 5 channels of EEG (T3-Cz, Cz-T4, F4-M1, C4-M1, O2-M1). 3 channels of Chin EMG, 4 channels of EOG and 1 channel for ECG. All channels were sampled at 256 Hz.  This polysomnographic procedure is designed to evaluate (1) the complaint of excessive daytime sleepiness by quantifying the time required to fall asleep and (2) the possibility of narcolepsy by checking for abnormally short latencies to REM sleep. Electrographic variables include EEG, EMG, EOG and ECG. Patients are monitored throughout four or five 20-minute opportunities to sleep (naps) at two-hour intervals. For each nap, the patient is allowed 20 minutes to fall asleep. Once asleep, the patient is awakened after 15 minutes. Between naps, the patient is kept as alert as possible. A sleep latency of 20 minutes indicates that no sleep occurred. Parametric Analysis Total Number of Naps 5   NAP # Time of Nap Sleep Latency (mins) REM Latency (mins) Sleep Time Percent Awake Time Percent   1 08:57 10.0 20.0 58 42    2 10:56 2.5 20.0 80 20    3 12:38 6.5 20.0 69 31    4 14:31 10.0 20.0 55 45    5 16:18 10.5 20.0 59 41     MSLT Summary of Naps  Sleepiness Index: 60.5  Mean Sleep Latency: 7.9  Number of Naps with REM Sleep: 0   Results from Preceding PSG Study  Sleep Onset Time 22:54 Sleep  Efficiency (%) 86.63%  Rise Time 06:40 Sleep Latency (min)  Total Sleep Time 7h REM Latency (min) 2h   Name: Zoee, Fussell MRN: [id number]  Study Date: 11/10/2023 Procedure #: 0 DOB: 2000-08-03  INTERPRETATION:    The patient had a nap study, MSLT, next  day on 11/10/23 with a mean sleep latency of 7.9 minutes for 5 naps and 0 SOREMPs (sleep onset REM periods) noted. 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 Her UDS on 11/10/23 was negative for any neurotropic or psychotropic drugs, positive for acetaminophen .  IMPRESSION:  1. Idiopathic hypersomnolence 2. Primary Snoring 3. Dysfunctions associated with sleep stages or arousal from sleep   RECOMMENDATIONS:  1. The overnight sleep study and next day nap study indicate no significantly sleep disordered breathing, except for mild intermittent snoring. The low mean sleep latency (7.9 min) and 0 SOREMPs (sleep onset REM periods) for the nap study indicate significant sleepiness, but findings do not fulfill criteria for narcolepsy. The findings, along with the patient's clinical history support the diagnosis of idiopathic hypersomnolence. Treatment options should be discussed with the patient.   2. The patient should be cautioned not to drive, work at heights, or operate dangerous or heavy equipment when tired or sleepy. Review and reiteration of good sleep hygiene measures should be pursued with any patient. 3. The night time sleep study shows some sleep fragmentation and abnormal sleep stage percentages; these are nonspecific findings and per se do not signify an intrinsic sleep disorder or a cause for the patient's sleep-related symptoms. Causes include (but are not limited to) the first night effect of the sleep study, circadian rhythm disturbances, medication effect or an underlying mood disorder or medical problem.  4. The patient will be seen in follow-up in the sleep  clinic at [facility] for discussion of the test results, symptom and treatment compliance review, further management strategies, etc. The referring provider will be notified of the test results. I certify that I have reviewed the entire raw data recording prior to the issuance of this report in accordance with the Standards of Accreditation of the American Academy of Sleep Medicine (AASM).  Debbra Fairy, MD, PhD Medical Director, [facility]) Diplomat, ABPN (Neurology and Sleep)  MSLT Technical Report:   General Information  Name: Valery, Farfan BMI: 25 Physician: Debbra Fairy, MD  ID: [id number] Height: 62 in Technician: Pattie Borders RPSGT  Sex: Female Weight: 141 lb Record: [id number]  Age: 61 [2001/03/24] Date: 11/10/2023 Scorer: Pattie Borders RPSGT   Nap 1 Nap Start: 08:57:16 AM Nap End: 09:22:03 AM    Latency to Sleep Onset: 10.69m Total Sleep Time: 14.52m Stg Dur REM N1 N2 N3   0.41m 2.44m 12.73m 0.15m    Nap 2 Nap Start: 10:56:28 AM Nap End: 11:14:09 AM    Latency to Sleep Onset: 2.28m Total Sleep Time: 14.4m Stg Dur REM N1 N2 N3   0.12m 2.58m 11.80m 0.43m    Nap 3 Nap Start: 12:38:17 PM Nap End: 12:59:34 PM    Latency to Sleep Onset: 6.81m Total Sleep Time: 14.49m Stg Dur REM N1 N2 N3   0.74m 1.62m 13.70m 0.35m    Nap 4 Nap Start: 02:31:34 PM Nap End: 02:56:23 PM    Latency to Sleep Onset: 10.64m Total Sleep Time: 13.27m Stg Dur REM N1 N2 N3   0.41m 0.60m 13.69m 0.61m    Nap 5 Nap Start: 04:18:05 PM Nap End: 04:43:22 PM    Latency to Sleep Onset: 10.82m Total Sleep Time: 15.29m Stg Dur REM N1 N2 N3   0.64m 0.16m 14.33m 0.77m    Mean Sleep Latency: 7.69m Number of Sleep Onset REM Periods: 0

## 2023-11-24 NOTE — Telephone Encounter (Signed)
-----   Message from Debbra Fairy sent at 11/19/2023  5:26 PM EDT ----- Patient referred by PCP, seen by me on 07/17/23, diagnostic PSG on 11/09/23, MSLT on 11/10/23.   Please call and notify the patient that the recent sleep study and next day nap study did show significant sleepiness, in keeping with what we call IH: idiopathic hypersomnolence. No significant OSA noted. Some mild snoring. UDS positive for acetaminophen  (ie Tylenol ).  Please inform patient that I would like to go over the details of the study and treatment options during a follow up appointment. Arrange a followup appointment. Also, route or fax report to referring MD.   Thanks,  Debbra Fairy, MD, PhD [facility] ([facility])

## 2023-11-24 NOTE — Telephone Encounter (Signed)
 Called pt and LVM (ok per DPR) asking for call back to discuss her sleep study results.

## 2023-11-25 ENCOUNTER — Encounter: Payer: Self-pay | Admitting: Family Medicine

## 2023-11-25 DIAGNOSIS — G4711 Idiopathic hypersomnia with long sleep time: Secondary | ICD-10-CM | POA: Insufficient documentation

## 2023-11-25 NOTE — Telephone Encounter (Signed)
-----   Message from Debbra Fairy sent at 11/19/2023  5:26 PM EDT ----- Patient referred by PCP, seen by me on 07/17/23, diagnostic PSG on 11/09/23, MSLT on 11/10/23.   Please call and notify the patient that the recent sleep study and next day nap study did show significant sleepiness, in keeping with what we call IH: idiopathic hypersomnolence. No significant OSA noted. Some mild snoring. UDS positive for acetaminophen  (ie Tylenol ).  Please inform patient that I would like to go over the details of the study and treatment options during a follow up appointment. Arrange a followup appointment. Also, route or fax report to referring MD.   Thanks,  Debbra Fairy, MD, PhD [facility] ([facility])

## 2023-11-25 NOTE — Telephone Encounter (Signed)
 Pt returned call for her sleep study results.  I relayed to her via Dr. Dail Drought note.  I made her a follow up appt for tomorrow at 0915 to go over her results/ and options.  She appreciated the call back and appt.

## 2023-11-26 ENCOUNTER — Encounter: Payer: Self-pay | Admitting: Neurology

## 2023-11-26 ENCOUNTER — Ambulatory Visit: Admitting: Neurology

## 2023-11-26 VITALS — BP 109/76 | HR 72 | Ht 62.0 in | Wt 140.6 lb

## 2023-11-26 DIAGNOSIS — G4711 Idiopathic hypersomnia with long sleep time: Secondary | ICD-10-CM | POA: Diagnosis not present

## 2023-11-26 NOTE — Patient Instructions (Signed)
 I believe you have a condition called idiopathic hypersomnolence. This means, that you have a sleep disorder that manifests with excessive sleep and excessive sleepiness during the day. We may have to try different medications that may help you stay awake during the day. Not everything works with everybody the same way. Wake promoting agents include stimulants and non-stimulant type medications. The most common side effects with stimulants are weight loss, insomnia, nervousness, headaches, palpitations, rise in blood pressure, anxiety. Stimulants can be addictive and subject to abuse. Non-stimulant type wake promoting medications include Provigil and Nuvigil, most common side effects include headaches, nervousness, insomnia, hypertension.  We will start generic Provigil (modafinil) 100 mg: 1 pill up to 2 times a day as needed. Take 1st dose around 7 AM and 2nd dose between noon and 2 PM. Avoid after 3 PM. Side effects include (but are not limited to): high blood pressure, headache, nervousness, palpitations, GI upset, tremor.  You can start with 1 pill once daily and don't have to take it every single day (such as taking a break on the weekend).  As discussed, there is a potential interaction between Provigil and Oral contraceptives. When these two medicines are taken together, your body may process the steroids in the birth control pills more quickly. The effects of your birth control pills may decrease, resulting in breakthrough bleeding, spotting, or (unplanned/unwanted) pregnancy. Please use a second method for contraception such as barrier protection.

## 2023-11-26 NOTE — Progress Notes (Signed)
 Subjective:    Patient ID: Jasmine Wyatt Wyatt is a 23 y.o. female.  HPI    Interim history:   Jasmine Wyatt Wyatt is a 23 year old female with an underlying benign medical history with the exception of borderline overweight state, who presents for follow-up consultation of Jasmine hypersomnia, after interim sleep testing.  The patient is unaccompanied today.  I first met Jasmine at the request of Jasmine primary care physician on 07/17/2023, at which time the patient reported a several year history of daytime somnolence, nonrestorative sleep and vivid dreams.  Symptoms had started in college.  She was advised to proceed with extended sleep testing in the form of a nocturnal polysomnogram and next-day nap test.  She had a baseline polysomnogram through our office on 11/09/2023 followed by a next-day MSLT on 11/10/2023.  Baseline sleep study did not show any significant sleep disordered breathing.  She had mild intermittent snoring.  She had a 5 nap MSLT on 11/10/2023 which showed a mean sleep latency of 7.9 minutes and 0 sleep onset REM periods.  She had a UDS on 11/10/2023, which showed the presence of acetaminophen .  Sleep test findings were not in keeping with narcolepsy but fulfilled criteria for idiopathic hypersomnolence.  Today, 11/26/2023: She reports feeling about the same, sometimes she has difficulty sleeping at night.  She has not had any coffee today and feels sleepy.  Epworth sleepiness score is 17 out of 24.  Recently, she had to drive to [place] and had to pull over several times.  She avoids long distance driving.  She is currently not taking an oral contraceptive pill and is not avoiding pregnancy by staying abstinent.  We talked about different treatment options for symptomatic improvement of Jasmine daytime somnolence and we talked about Jasmine sleep study results in detail today. She recently started Jasmine Wyatt  low-dose.  First on 5 mg for the first 2 weeks and now on 10 mg.  She had tried it a couple years ago for anxiety and  reports that she talked to Jasmine Jasmine Wyatt about Jasmine anxiety and started medication recently.   The patient's allergies, current medications, family history, past medical history, past social history, past surgical history and problem list were reviewed and updated as appropriate.  Previously:  07/17/2023: (She) reports a longer standing history of daytime somnolence, nonrestorative sleep and vivid dreams.  She reports that sleepiness first was bothersome and noticeable when she was a Jasmine Wyatt Wyatt in college.  Jasmine Epworth sleepiness score is 15 out of 24, fatigue severity score is 56 out of 63.  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  She has trouble waking up in the morning, she sleeps deeply and requires multiple alarms or someone to physically wake up.  Sometimes he sleeps in the same bed with Jasmine Wyatt.  She has a half sister on dad side.  No family history of narcolepsy but dad has sleep apnea and uses a PAP machine.  She endorses a history of sleep talking and sleepwalking particularly as a child.  She denies any telltale symptoms of cataplexy and hypnagogic or hypnopompic hallucinations.  She has very vivid dreams, she has had 1 episode of sleep paralysis that she recalls.  She can have dreams and Jasmine naps.  She avoids napping.  She has avoided caffeine since August 2024.  She used to drink energy drinks while in college.  Current bedtime is between 8:30 PM and 9 and rise time is around 7 AM.  She  denies nightly nocturia or recurrent morning headaches or nocturnal headaches.  She currently does not drink caffeine daily.  She is a non-smoker and does not utilize any illicit drugs, does not smoke any marijuana.   She does not wake up rested.  She feels sleepy at the wheel at times but has to roll over, never dozed off at the wheel.  she lives with Jasmine Wyatt and Jasmine Wyatt Wyatt.  They have 1 rabbit in the household.   I reviewed your office visit note from 05/19/2023 and from  07/14/2023.  Upon chart review, she had blood tests for STI recently, reports that she does not currently have a significant other.  She did not recently have a pregnancy test.  She did have a pregnancy termination in early 2024.  She has had blood tests for a concern for fatigue and tiredness including a thyroid  check.riate.    Jasmine Past Medical History Is Significant For: History reviewed. No pertinent past medical history.  Jasmine Past Surgical History Is Significant For: Past Surgical History:  Procedure Laterality Date   NO PAST SURGERIES     ROOT CANAL  10/2015    Jasmine Family History Is Significant For: Family History  Problem Relation Age of Onset   Hypertension Father    Sleep apnea Father    Food Allergy  Wyatt        peanut, shellfish, fish   Allergic rhinitis Wyatt    Cancer Maternal Grandmother        lung   Hypertension Maternal Grandmother    Asthma Cousin    Eczema Cousin    Angioedema Neg Hx    Atopy Neg Hx    Immunodeficiency Neg Hx    Urticaria Neg Hx     Jasmine Social History Is Significant For: Social History   Socioeconomic History   Marital status: Single    Spouse name: Not on file   Number of children: 0   Years of education: Not on file   Highest education level: Bachelor's degree (e.g., BA, AB, BS)  Occupational History   Occupation: student  Tobacco Use   Smoking status: Never    Passive exposure: Never   Smokeless tobacco: Never  Vaping Use   Vaping status: Never Used  Substance and Sexual Activity   Alcohol use: Yes    Comment: socially   Drug use: Not Currently   Sexual activity: Yes    Birth control/protection: Condom  Other Topics Concern   Not on file  Social History Narrative   She currently lives with Jasmine mother starlet and Jasmine Wyatt Jasmine Wyatt Wyatt. Jasmine parents are separated. Jasmine mother is a Social research officer, government and Jasmine father is a Actuary. She runs track in the fall in the spring and plans on being a fashion designer      Caffeine: less now,  was 2 cups/day   Social Drivers of Health   Financial Resource Strain: Patient Declined (07/12/2023)   Overall Financial Resource Strain (CARDIA)    Difficulty of Paying Living Expenses: Patient declined  Food Insecurity: No Food Insecurity (07/12/2023)   Hunger Vital Sign    Worried About Running Out of Food in the Last Year: Never true    Ran Out of Food in the Last Year: Never true  Transportation Needs: No Transportation Needs (07/12/2023)   PRAPARE - Administrator, Civil Service (Medical): No    Lack of Transportation (Non-Medical): No  Physical Activity: Sufficiently Active (07/12/2023)   Exercise Vital Sign  Days of Exercise per Week: 6 days    Minutes of Exercise per Session: 40 min  Stress: Stress Concern Present (07/12/2023)   [employer] of Occupational Health - Occupational Stress Questionnaire    Feeling of Stress : Rather much  Social Connections: Moderately Integrated (07/12/2023)   Social Connection and Isolation Panel [NHANES]    Frequency of Communication with Friends and Family: More than three times a week    Frequency of Social Gatherings with Friends and Family: Three times a week    Attends Religious Services: More than 4 times per year    Active Member of Clubs or Organizations: Yes    Attends Banker Meetings: More than 4 times per year    Marital Status: Never married    Jasmine Allergies Are:  No Known Allergies:   Jasmine Current Medications Are:  Outpatient Encounter Medications as of 11/26/2023  Medication Sig   escitalopram  (Jasmine Wyatt ) 10 MG tablet Take 0.5 tablets (5 mg total) by mouth daily for 14 days, THEN 1 tablet (10 mg total) daily for 14 days.   No facility-administered encounter medications on file as of 11/26/2023.  :  Review of Systems:  Out of a complete 14 point review of systems, all are reviewed and negative with the exception of these symptoms as listed below:  Review of Systems  Neurological:        RM 9 Sleep  Study Results Pt is here Alone. Pt states she just want to know what Jasmine results mean.     Objective:  Neurological Exam  Physical Exam Physical Examination:   Vitals:   11/26/23 0930  BP: 109/76  Pulse: 72    General Examination: The patient is a very pleasant 23 y.o. female in no acute distress. She appears well-developed and well-nourished and well groomed.   HEENT: Normocephalic, atraumatic, pupils are equal, round and reactive to light, tracking well-preserved, no nystagmus noted, hearing grossly intact.  Face is symmetric, normal facial animation, speech without dysarthria, hypophonia or voice tremor.  Airway examination reveals stable findings, good dental hygiene, tongue protrudes centrally and palate elevates symmetrically.  Neck supple with good range of motion, no carotid bruits.     Chest: Clear to auscultation without wheezing, rhonchi or crackles noted.   Heart: S1+S2+0, regular and normal without murmurs, rubs or gallops noted.    Abdomen: Soft, non-tender and non-distended.   Extremities: There is no pitting edema in the distal lower extremities bilaterally.    Skin: Warm and dry without trophic changes noted.    Musculoskeletal: exam reveals no obvious joint deformities.    Neurologically:  Mental status: The patient is awake, alert and oriented in all 4 spheres. Jasmine immediate and remote memory, attention, language skills and fund of knowledge are appropriate. There is no evidence of aphasia, agnosia, apraxia or anomia. Speech is clear with normal prosody and enunciation. Thought process is linear. Mood is normal and affect is normal.  Cranial nerves II - XII are as described above under HEENT exam.  Motor exam: Normal bulk, strength and tone is noted. There is no obvious action or resting tremor.  Fine motor skills and coordination: grossly intact.  Cerebellar testing: No dysmetria or intention tremor. There is no truncal or gait ataxia.  Sensory exam: intact  to light touch in the upper and lower extremities.  Gait, station and balance: She stands easily. No veering to one side is noted. No leaning to one side is noted. Posture is age-appropriate and  stance is narrow based. Gait shows normal stride length and normal pace. No problems turning are noted.   Assessment and Plan:   Ms. Olveda is a 23 year old female with an underlying benign medical history, who presents for follow-up consultation of Jasmine hypersomnia after interim sleep testing.  She had a baseline polysomnogram through our office on 11/09/2023 followed by a next-day MSLT on 11/10/2023.  Baseline sleep study did not show any significant sleep disordered breathing.  She had mild intermittent snoring.  She had a 5 nap MSLT on 11/10/2023 which showed a mean sleep latency of 7.9 minutes and 0 sleep onset REM periods.  She had a UDS on 11/10/2023, which showed the presence of acetaminophen .  She recently started Jasmine Wyatt  after completing the sleep study.  History and examination as well as the recent polysomnographic test results support diagnosis of idiopathic hypersomnolence.  We talked about this diagnosis, prognosis and treatment options at length today.  She is advised to start Provigil generic 100 mg strength 1 to twice daily.  We talked about expectations, limitations of the medication and common side effects and the potential interaction with OCPs.  She is agreeable to proceeding.  Advised to follow-up in this clinic in about 3 months, sooner if needed.  She is encouraged to stay in touch via MyChart message in the interim.  I answered all Jasmine questions today and she was in agreement.  I spent 40 minutes in total face-to-face time and in reviewing records during pre-charting, more than 50% of which was spent in counseling and coordination of care, reviewing test results, reviewing medications and treatment regimen and/or in discussing or reviewing the diagnosis of IH, the prognosis and treatment options.  Pertinent laboratory and imaging test results that were available during this visit with the patient were reviewed by me and considered in my medical decision making (see chart for details).

## 2023-12-11 ENCOUNTER — Ambulatory Visit: Payer: 59 | Admitting: Dermatology

## 2023-12-11 ENCOUNTER — Encounter: Payer: Self-pay | Admitting: Dermatology

## 2023-12-11 VITALS — BP 116/78 | HR 76

## 2023-12-11 DIAGNOSIS — L700 Acne vulgaris: Secondary | ICD-10-CM

## 2023-12-11 DIAGNOSIS — L810 Postinflammatory hyperpigmentation: Secondary | ICD-10-CM

## 2023-12-11 DIAGNOSIS — L708 Other acne: Secondary | ICD-10-CM

## 2023-12-11 MED ORDER — SPIRONOLACTONE 100 MG PO TABS: 100.0000 mg | ORAL_TABLET | Freq: Every day | ORAL | 11 refills | Status: DC

## 2023-12-11 MED ORDER — TRETINOIN 0.05 % EX CREA: TOPICAL_CREAM | Freq: Every day | CUTANEOUS | 1 refills | Status: AC

## 2023-12-11 MED ORDER — CLINDAMYCIN PHOSPHATE 1 % EX SWAB: 1.0000 | Freq: Every morning | CUTANEOUS | 11 refills | Status: AC

## 2023-12-11 NOTE — Patient Instructions (Addendum)
 Date: Thu Dec 11 2023  Hello Renay Carota,  Thank you for visiting today. Here is a summary of the key instructions:  Medications: - Take spironolactone once a day with dinner - Use clindamycin swabs on face every morning   - Apply tretinoin 0.05% at night:   - Use 2 nights a week for the first month   - Increase to 3 nights a week in July if tolerated   - Use a pea-sized amount   - Apply light moisturizer before and after tretinoin  Skincare Routine: - Morning:   - Wash face with CeraVe cleanser   - Apply clindamycin swabs   - Apply moisturizer with sunscreen  - Night:   - Wash face   - On tretinoin nights:     - Apply light moisturizer     - Apply tretinoin     - Apply more moisturizer   - On non-tretinoin nights:     - Wash and moisturize only  - Sunscreen: Continue using sunscreen daily. Consider trying the recommended [place] sunscreen (available on Amazon for $19)  - Follow-up: Return for follow-up appointment in 4 months  Important Notes: - If you experience lightheadedness or dizziness from spironolactone, consider lowering the dose or stopping - If skin irritation occurs when increasing tretinoin use, return to previous frequency - Do not stop your regimen unless you have side effects from the pill - Call if symptoms worsen or do not improve  We look forward to seeing you at your next visit. If you have any questions or concerns before then, please do not hesitate to contact our office.  Warm regards,  Dr. Louana Roup, Dermatology        Recommended Sunscreen: [email or web address] ([email or web address])     Important Information   Due to recent changes in healthcare laws, you may see results of your pathology and/or laboratory studies on MyChart before the doctors have had a chance to review them. We understand that in some cases there may be results that are confusing or concerning to you. Please understand that not all results are received at the same time and often the  doctors may need to interpret multiple results in order to provide you with the best plan of care or course of treatment. Therefore, we ask that you please give us  2 business days to thoroughly review all your results before contacting the office for clarification. Should we see a critical lab result, you will be contacted sooner.     If You Need Anything After Your Visit   If you have any questions or concerns for your doctor, please call our main line at [phone]. If no one answers, please leave a voicemail as directed and we will return your call as soon as possible. Messages left after 4 pm will be answered the following business day.    You may also send us  a message via MyChart. We typically respond to MyChart messages within 1-2 business days.  For prescription refills, please ask your pharmacy to contact our office. Our fax number is [phone].  If you have an urgent issue when the clinic is closed that cannot wait until the next business day, you can page your doctor at the number below.     Please note that while we do our best to be available for urgent issues outside of office hours, we are not available 24/7.    If you have an urgent issue and are unable to reach us , you may choose to  seek medical care at your doctor's office, retail clinic, urgent care center, or emergency room.   If you have a medical emergency, please immediately call 911 or go to the emergency department. In the event of inclement weather, please call our main line at [phone] for an update on the status of any delays or closures.  Dermatology Medication Tips: Please keep the boxes that topical medications come in in order to help keep track of the instructions about where and how to use these. Pharmacies typically print the medication instructions only on the boxes and not directly on the medication tubes.   If your medication is too expensive, please contact our office at [phone] or send us  a  message through MyChart.    We are unable to tell what your co-pay for medications will be in advance as this is different depending on your insurance coverage. However, we may be able to find a substitute medication at lower cost or fill out paperwork to get insurance to cover a needed medication.    If a prior authorization is required to get your medication covered by your insurance company, please allow us  1-2 business days to complete this process.   Drug prices often vary depending on where the prescription is filled and some pharmacies may offer cheaper prices.   The website www.goodrx.com contains coupons for medications through different pharmacies. The prices here do not account for what the cost may be with help from insurance (it may be cheaper with your insurance), but the website can give you the price if you did not use any insurance.  - You can print the associated coupon and take it with your prescription to the pharmacy.  - You may also stop by our office during regular business hours and pick up a GoodRx coupon card.  - If you need your prescription sent electronically to a different pharmacy, notify our office through [facility] or by phone at [phone]

## 2023-12-11 NOTE — Progress Notes (Signed)
   New Patient Visit   Subjective  Jasmine Wyatt is a 23 y.o. female who presents for the following: Acne  Patient states she has acne located at the face that she would like to have examined. Patient reports the areas have been there for 10 years. She reports the areas are bothersome. Patient reports the areas can be painful. Patient rates irritation 7 out of 10. Patient reports she has previously been treated for these areas by her esthetician.  Patient reports her regimen consist of Cerave Foaming Cleanser, Inky List Mandelic Serum at night, Cerave Facial Moisturizer at night, LRP Oil control moisturizer in the am. Periodically she will use Neutrogena Sunscreen. She also will use VI Peel once yearly. She does get deeper bumps along with Superficial bump. She feels her T-Zone is the areas she will normally get break outs.  The following portions of the chart were reviewed this encounter and updated as appropriate: medications, allergies, medical history  Review of Systems:  No other skin or systemic complaints except as noted in HPI or Assessment and Plan.  Objective  Well appearing patient in no apparent distress; mood and affect are within normal limits.  A focused examination was performed of the following areas: Face  Relevant exam findings are noted in the Assessment and Plan.            Assessment & Plan   INFLAMMATORY ACNE VULGARIS Exam: Open comedones and inflammatory papules  Flared  Treatment Plan: - Prescribed Clindamycin Swabs to swab face in the am after washing daily - Prescribed Spironolactone 100 mg to take nightly to help control Hormonal Acne, Educated on potential side effects - Prescribed Tretinoin 0.05% to apply 2 nights weekly for the first month, after the first month increase to 3 nights weekly(M,W,F)  Hyperpigmentation Exam: brown macules in the areas of prior acne lesions  Patient Education I counseled the patient regarding the following: Skin  care: Recommend minimizing sun exposure, wearing sunscreen and protective clothing. / Expectations: Post Inflammatory pigmentary change is lighter or darker discoloration than surrounding skin resulting from trauma or rashes. Areas tend to normalize over time, but can take months to years.  Treatment Plan: - topical retinoid started today will help lift some excess pigment - once skin is acclimated to retinoid we will add in a separate topical lightening agent - recommended broad spectrum SPF30 every day, hats and sun avoidence.   OTHER ACNE   Related Medications spironolactone (ALDACTONE) 100 MG tablet Take 1 tablet (100 mg total) by mouth daily. Take at night with heavy meal and plenty of water clindamycin (CLEOCIN T) 1 % SWAB Apply 1 Application topically in the morning. tretinoin (RETIN-A) 0.05 % cream Apply topically at bedtime. apply 2 nights weekly for the first month, after the first month increase to 3 nights weekly (M,W,F)  Return in about 4 months (around 04/11/2024) for Acne F/U.  I, Jetta Ager, am acting as Neurosurgeon for [employer], DO.  Documentation: I have reviewed the above documentation for accuracy and completeness, and I agree with the above.  Louana Roup, DO

## 2023-12-12 ENCOUNTER — Encounter: Payer: Self-pay | Admitting: Neurology

## 2023-12-15 ENCOUNTER — Telehealth: Payer: Self-pay | Admitting: Pharmacist

## 2023-12-15 ENCOUNTER — Other Ambulatory Visit: Payer: Self-pay | Admitting: Family Medicine

## 2023-12-15 DIAGNOSIS — Z3009 Encounter for other general counseling and advice on contraception: Secondary | ICD-10-CM

## 2023-12-15 DIAGNOSIS — F411 Generalized anxiety disorder: Secondary | ICD-10-CM

## 2023-12-15 MED ORDER — MODAFINIL 100 MG PO TABS: ORAL_TABLET | ORAL | 0 refills | Status: DC

## 2023-12-15 NOTE — Telephone Encounter (Signed)
 Pharmacy Patient Advocate Encounter  Received notification from [facility] that Prior Authorization for Modafinil 100MG  tablets has been APPROVED from 12/15/2023 to 12/14/2024   PA #/Case ID/Reference #: [id number]

## 2023-12-15 NOTE — Telephone Encounter (Signed)
 From office note 11/26/23:

## 2023-12-15 NOTE — Telephone Encounter (Signed)
 Pharmacy Patient Advocate Encounter   Received notification from Patient Pharmacy that prior authorization for Modafinil 100MG  tablets is required/requested.   Insurance verification completed.   The patient is insured through [facility] .   Per test claim: PA required; PA submitted to above mentioned insurance via CoverMyMeds Key/confirmation #/EOC [id number] Status is pending

## 2024-01-10 ENCOUNTER — Other Ambulatory Visit: Payer: Self-pay | Admitting: Family Medicine

## 2024-01-10 DIAGNOSIS — F411 Generalized anxiety disorder: Secondary | ICD-10-CM

## 2024-02-17 ENCOUNTER — Ambulatory Visit: Admitting: Neurology

## 2024-02-17 ENCOUNTER — Encounter: Payer: Self-pay | Admitting: Neurology

## 2024-03-09 ENCOUNTER — Encounter: Payer: Self-pay | Admitting: Sports Medicine

## 2024-03-11 ENCOUNTER — Other Ambulatory Visit: Payer: Self-pay | Admitting: Family Medicine

## 2024-03-11 DIAGNOSIS — F411 Generalized anxiety disorder: Secondary | ICD-10-CM

## 2024-05-03 ENCOUNTER — Encounter: Payer: Self-pay | Admitting: Dermatology

## 2024-05-03 ENCOUNTER — Ambulatory Visit: Admitting: Dermatology

## 2024-05-03 VITALS — BP 117/85

## 2024-05-03 DIAGNOSIS — L700 Acne vulgaris: Secondary | ICD-10-CM | POA: Diagnosis not present

## 2024-05-03 DIAGNOSIS — L810 Postinflammatory hyperpigmentation: Secondary | ICD-10-CM | POA: Diagnosis not present

## 2024-05-03 DIAGNOSIS — L708 Other acne: Secondary | ICD-10-CM

## 2024-05-03 MED ORDER — SPIRONOLACTONE 50 MG PO TABS: 150.0000 mg | ORAL_TABLET | Freq: Every day | ORAL | 5 refills | Status: AC

## 2024-05-03 NOTE — Progress Notes (Signed)
   Follow-Up Visit   Subjective  Jasmine Wyatt is a 23 y.o. female established patient who presents for FOLLOW UP on the diagnoses listed below:  Patient was last evaluated on 12/11/23.   Acne: Prescribed Clindamycin  Swabs to swab face in the am after washing daily, Spironolactone  100 mg to take nightly to help control Hormonal Acne, Tretinoin  0.05% to apply 2 nights weekly for the first month, after the first month increase to 3 nights weekly(M,W,F) Patient reports sxs are better.    Are you nursing, pregnant or trying to conceive? No   The following portions of the chart were reviewed this encounter and updated as appropriate: medications, allergies, medical history  Review of Systems:  No other skin or systemic complaints except as noted in HPI or Assessment and Plan.  Objective  Well appearing patient in no apparent distress; mood and affect are within normal limits.   A focused examination was performed of the following areas: face   Relevant exam findings are noted in the Assessment and Plan.    Assessment & Plan   ACNE VULGARIS w/ PIH Exam: Open comedones and inflammatory papules  Improved but Not at goal  Acne vulgaris with postinflammatory hyperpigmentation, primarily during menstrual cycles. Current treatment shows improvement, but occasional flares with comedones and deeper lesions persist. Spironolactone  effectively reduces acne severity and menstrual symptoms.  - Increase spironolactone  to 150 mg daily at night. Use one 100 mg tablet and one 50 mg tablet until 100 mg tablets are finished, then switch to three 50 mg tablets. Discussed potential side effects: lightheadedness, dizziness, palpitations, and polyuria. Advise reverting to 100 mg if side effects occur. - Add salicylic acid 2% wash in the morning for oily skin and comedones. Provide samples of CeraVe salicylic acid wash. - Continue clindamycin  swabs and moisturizer as part of the daily regimen. -  Introduce Eucerin Radiant Tone serum for hyperpigmentation. Apply after clindamycin  and before sunscreen or moisturizer. Provide samples and a coupon for Eucerin Radiant Tone. - Continue tretinoin  three nights a week. Monitor for xerosis and adjust frequency if excessive desquamation or irritation occurs.    No follow-ups on file.   Documentation: I have reviewed the above documentation for accuracy and completeness, and I agree with the above.  I, Shirron Maranda, CMA, am acting as scribe for [employer], DO.   Delon Lenis, DO

## 2024-05-03 NOTE — Patient Instructions (Addendum)
 VISIT SUMMARY:  Today, we discussed your ongoing acne treatment and noted some improvements, particularly with the reduction of dark spots and menstrual-related acne. We also addressed occasional small white bumps and deeper acne lesions that you have been experiencing.  YOUR PLAN:  -ACNE VULGARIS WITH POSTINFLAMMATORY HYPERPIGMENTATION:  Acne vulgaris is a common skin condition that causes pimples, and postinflammatory hyperpigmentation refers to dark spots that remain after the pimples heal.  We will increase your spironolactone  dose to 150 mg daily at night to help manage your acne and menstrual symptoms. If you experience side effects like lightheadedness or dizziness, revert to 100 mg.  We are adding a salicylic acid 2% wash in the morning to help with oily skin and comedones. Continue using clindamycin  swabs and moisturizer daily every morning.  For hyperpigmentation, start using Eucerin Radiant Tone serum after clindamycin  and before sunscreen or moisturizer.  Continue applying tretinoin  three nights a week, and monitor for dryness or irritation.  INSTRUCTIONS:  Increase spironolactone  to 150 mg daily at night. Use one 100 mg tablet and one 50 mg tablet until the 100 mg tablets are finished, then switch to three 50 mg tablets. If you experience side effects like lightheadedness, dizziness, palpitations, or frequent urination, revert to 100 mg. Add salicylic acid 2% wash in the morning. Continue using clindamycin  swabs and moisturizer daily. Start using Eucerin Radiant Tone serum for hyperpigmentation after clindamycin  and before sunscreen or moisturizer. Continue applying tretinoin  three nights a week, and monitor for dryness or irritation. Important Information  Due to recent changes in healthcare laws, you may see results of your pathology and/or laboratory studies on MyChart before the doctors have had a chance to review them. We understand that in some cases there may be results that are  confusing or concerning to you. Please understand that not all results are received at the same time and often the doctors may need to interpret multiple results in order to provide you with the best plan of care or course of treatment. Therefore, we ask that you please give us  2 business days to thoroughly review all your results before contacting the office for clarification. Should we see a critical lab result, you will be contacted sooner.   If You Need Anything After Your Visit  If you have any questions or concerns for your doctor, please call our main line at [phone] If no one answers, please leave a voicemail as directed and we will return your call as soon as possible. Messages left after 4 pm will be answered the following business day.   You may also send us  a message via MyChart. We typically respond to MyChart messages within 1-2 business days.  For prescription refills, please ask your pharmacy to contact our office. Our fax number is [phone].  If you have an urgent issue when the clinic is closed that cannot wait until the next business day, you can page your doctor at the number below.    Please note that while we do our best to be available for urgent issues outside of office hours, we are not available 24/7.   If you have an urgent issue and are unable to reach us , you may choose to seek medical care at your doctor's office, retail clinic, urgent care center, or emergency room.  If you have a medical emergency, please immediately call 911 or go to the emergency department. In the event of inclement weather, please call our main line at [phone] for an update on  the status of any delays or closures.  Dermatology Medication Tips: Please keep the boxes that topical medications come in in order to help keep track of the instructions about where and how to use these. Pharmacies typically print the medication instructions only on the boxes and not directly on the  medication tubes.   If your medication is too expensive, please contact our office at [phone] or send us  a message through MyChart.   We are unable to tell what your co-pay for medications will be in advance as this is different depending on your insurance coverage. However, we may be able to find a substitute medication at lower cost or fill out paperwork to get insurance to cover a needed medication.   If a prior authorization is required to get your medication covered by your insurance company, please allow us  1-2 business days to complete this process.  Drug prices often vary depending on where the prescription is filled and some pharmacies may offer cheaper prices.  The website www.goodrx.com contains coupons for medications through different pharmacies. The prices here do not account for what the cost may be with help from insurance (it may be cheaper with your insurance), but the website can give you the price if you did not use any insurance.  - You can print the associated coupon and take it with your prescription to the pharmacy.  - You may also stop by our office during regular business hours and pick up a GoodRx coupon card.  - If you need your prescription sent electronically to a different pharmacy, notify our office through [facility] or by phone at [phone]

## 2024-06-08 ENCOUNTER — Other Ambulatory Visit: Payer: Self-pay | Admitting: Diagnostic Neuroimaging

## 2024-06-08 NOTE — Telephone Encounter (Signed)
 Last refilled by patient on : 12/19/23 Last office visit : 11/26/23 with Dr. Buck  Next office visit :  08/24/23 with Dr. Buck   Per last note -  We will start generic Provigil  (modafinil ) 100 mg: 1 pill up to 2 times a day as needed. Take 1st dose around 7 AM and 2nd dose between noon and 2 PM. Avoid after 3 PM. Side effects include (but are not limited to): high blood pressure, headache, nervousness, palpitations, GI upset, tremor.  You can start with 1 pill once daily and don't have to take it every single day (such as taking a break on the weekend).  Prior Authorization for Modafinil  100MG  tablets has been APPROVED from 12/15/2023 to 12/14/2024

## 2024-06-29 ENCOUNTER — Ambulatory Visit: Admitting: Family Medicine

## 2024-06-29 VITALS — BP 117/71 | HR 88 | Temp 98.8°F | Ht 62.0 in | Wt 137.0 lb

## 2024-06-29 DIAGNOSIS — J0130 Acute sphenoidal sinusitis, unspecified: Secondary | ICD-10-CM | POA: Diagnosis not present

## 2024-06-29 MED ORDER — AMOXICILLIN-POT CLAVULANATE 875-125 MG PO TABS: 1.0000 | ORAL_TABLET | Freq: Two times a day (BID) | ORAL | 0 refills | Status: AC

## 2024-06-29 NOTE — Progress Notes (Signed)
 "  Acute Office Visit  Patient ID: Jasmine Wyatt, female    DOB: [birth year], 23 y.o.   MRN: [id number]  PCP: Alvan Dorothyann BIRCH, MD  Chief Complaint  Patient presents with   Sinusitis    X 2 weeks    Subjective:     HPI  Discussed the use of AI scribe software for clinical note transcription with the patient, who gave verbal consent to proceed.  History of Present Illness DEMESHIA Wyatt is a 23 year old female who presents with persistent sinus congestion and ear pain.  Sinus congestion and facial pain - Persistent sinus congestion for over two weeks - Facial pain localized around the nasal bridge and eyes - Headaches associated with facial pain - Nasal discharge initially colored, now clear - Symptoms improved for two days, then worsened again  Ear pain - Bilateral ear pain ongoing with sinus congestion  Cough - Cough present at onset, now improved - Cough only occurs when outside  Throat pain - Severe throat pain at onset, described as 'knives', now resolved  Constitutional and gastrointestinal symptoms - No fever at onset of symptoms - No upset stomach  Current treatments - Recently started Flonase  for congestion relief  Allergies - No reported allergies   ROS     Objective:    BP 117/71   Pulse 88   Temp 98.8 F (37.1 C) (Oral)   Ht 5' 2 (1.575 m)   Wt 137 lb (62.1 kg)   SpO2 98%   BMI 25.06 kg/m     Physical Exam Constitutional:      Appearance: Normal appearance.  HENT:     Head: Normocephalic and atraumatic.     Right Ear: Tympanic membrane, ear canal and external ear normal. There is no impacted cerumen.     Left Ear: Tympanic membrane, ear canal and external ear normal. There is no impacted cerumen.     Nose: Nose normal.     Mouth/Throat:     Pharynx: Oropharynx is clear.  Eyes:     Conjunctiva/sclera: Conjunctivae normal.  Neck:     Comments: Small swollen right ant cerv LN Cardiovascular:     Rate and Rhythm: Normal  rate and regular rhythm.  Pulmonary:     Effort: Pulmonary effort is normal.     Breath sounds: Normal breath sounds.  Musculoskeletal:     Cervical back: Neck supple. No tenderness.  Skin:    General: Skin is warm and dry.  Neurological:     Mental Status: She is alert and oriented to person, place, and time.  Psychiatric:        Mood and Affect: Mood normal.       No results found for any visits on 06/29/24.     Assessment & Plan:   Problem List Items Addressed This Visit   None Visit Diagnoses       Acute non-recurrent sphenoidal sinusitis    -  Primary   Relevant Medications   amoxicillin -clavulanate (AUGMENTIN ) 875-125 MG tablet       Assessment and Plan Assessment & Plan Acute sinusitis Likely bacterial sinusitis following a viral infection. - Prescribed Augmentin  875 mg orally twice a day for 5 days. - Advised continued use of Flonase  for nasal congestion. - Recommended saline irrigation to clear mucus. - Encouraged hydration. - Instructed to contact if symptoms do not improve by day 5 for potential extension of antibiotic course.    Meds ordered this encounter  Medications  amoxicillin -clavulanate (AUGMENTIN ) 875-125 MG tablet    Sig: Take 1 tablet by mouth 2 (two) times daily.    Dispense:  14 tablet    Refill:  0    No follow-ups on file.  Dorothyann Byars, MD [facility] at [facility]   "

## 2024-07-05 ENCOUNTER — Encounter: Payer: Self-pay | Admitting: Family

## 2024-08-16 ENCOUNTER — Ambulatory Visit: Admitting: Neurology

## 2024-08-23 ENCOUNTER — Ambulatory Visit: Admitting: Neurology
